# Patient Record
Sex: Male | Born: 1955 | Race: Black or African American | Hispanic: No | Smoking: Current every day smoker
Health system: Southern US, Community
[De-identification: ages and names within clinical notes are randomized; demographics above are authoritative.]

## PROBLEM LIST (undated history)

## (undated) VITALS — BP 131/91 | HR 63 | Temp 97.8°F | Resp 20 | Ht 70.5 in | Wt 184.0 lb

## (undated) DIAGNOSIS — C61 Malignant neoplasm of prostate: Secondary | ICD-10-CM

## (undated) DIAGNOSIS — M1990 Unspecified osteoarthritis, unspecified site: Secondary | ICD-10-CM

## (undated) DIAGNOSIS — I10 Essential (primary) hypertension: Secondary | ICD-10-CM

## (undated) HISTORY — PX: TONSILLECTOMY: SUR1361

## (undated) HISTORY — PX: COLONOSCOPY: SHX174

---

## 1999-04-18 ENCOUNTER — Emergency Department (HOSPITAL_COMMUNITY): Admission: EM | Admit: 1999-04-18 | Discharge: 1999-04-18 | Payer: Self-pay | Admitting: Emergency Medicine

## 2007-02-02 ENCOUNTER — Emergency Department (HOSPITAL_COMMUNITY): Admission: EM | Admit: 2007-02-02 | Discharge: 2007-02-02 | Payer: Self-pay | Admitting: Emergency Medicine

## 2007-02-16 ENCOUNTER — Emergency Department (HOSPITAL_COMMUNITY): Admission: EM | Admit: 2007-02-16 | Discharge: 2007-02-16 | Payer: Self-pay | Admitting: Emergency Medicine

## 2007-03-20 ENCOUNTER — Encounter (INDEPENDENT_AMBULATORY_CARE_PROVIDER_SITE_OTHER): Payer: Self-pay | Admitting: Family Medicine

## 2007-03-20 ENCOUNTER — Ambulatory Visit: Payer: Self-pay | Admitting: Internal Medicine

## 2007-03-20 LAB — CONVERTED CEMR LAB
ALT: 13 units/L (ref 0–53)
AST: 13 units/L (ref 0–37)
Albumin: 4.3 g/dL (ref 3.5–5.2)
Alkaline Phosphatase: 35 units/L — ABNORMAL LOW (ref 39–117)
BUN: 13 mg/dL (ref 6–23)
Basophils Absolute: 0 10*3/uL (ref 0.0–0.1)
Basophils Relative: 1 % (ref 0–1)
CO2: 27 meq/L (ref 19–32)
Calcium: 9.6 mg/dL (ref 8.4–10.5)
Chloride: 102 meq/L (ref 96–112)
Cholesterol: 147 mg/dL (ref 0–200)
Creatinine, Ser: 1.18 mg/dL (ref 0.40–1.50)
Eosinophils Absolute: 0.2 10*3/uL (ref 0.0–0.7)
Eosinophils Relative: 4 % (ref 0–5)
Glucose, Bld: 142 mg/dL — ABNORMAL HIGH (ref 70–99)
HCT: 41.4 % (ref 39.0–52.0)
HDL: 33 mg/dL — ABNORMAL LOW (ref >39)
Hemoglobin: 12.9 g/dL — ABNORMAL LOW (ref 13.0–17.0)
LDL Cholesterol: 97 mg/dL (ref 0–99)
Lymphocytes Relative: 34 % (ref 12–46)
Lymphs Abs: 2 10*3/uL (ref 0.7–4.0)
MCHC: 31.2 g/dL (ref 30.0–36.0)
MCV: 85.2 fL (ref 78.0–100.0)
Monocytes Absolute: 0.4 10*3/uL (ref 0.1–1.0)
Monocytes Relative: 7 % (ref 3–12)
Neutro Abs: 3.2 10*3/uL (ref 1.7–7.7)
Neutrophils Relative %: 54 % (ref 43–77)
Platelets: 414 10*3/uL — ABNORMAL HIGH (ref 150–400)
Potassium: 4.3 meq/L (ref 3.5–5.3)
RBC: 4.86 M/uL (ref 4.22–5.81)
RDW: 15.2 % (ref 11.5–15.5)
Sodium: 141 meq/L (ref 135–145)
Total Bilirubin: 0.4 mg/dL (ref 0.3–1.2)
Total CHOL/HDL Ratio: 4.5
Total Protein: 7.1 g/dL (ref 6.0–8.3)
Triglycerides: 84 mg/dL (ref <150)
VLDL: 17 mg/dL (ref 0–40)
WBC: 5.9 10*3/uL (ref 4.0–10.5)

## 2007-03-21 ENCOUNTER — Ambulatory Visit: Payer: Self-pay | Admitting: *Deleted

## 2008-09-15 ENCOUNTER — Emergency Department (HOSPITAL_COMMUNITY): Admission: EM | Admit: 2008-09-15 | Discharge: 2008-09-15 | Payer: Self-pay | Admitting: Emergency Medicine

## 2008-09-15 ENCOUNTER — Ambulatory Visit: Payer: Self-pay | Admitting: Internal Medicine

## 2008-10-25 ENCOUNTER — Observation Stay (HOSPITAL_COMMUNITY): Admission: AD | Admit: 2008-10-25 | Discharge: 2008-10-25 | Payer: Self-pay | Admitting: Psychiatry

## 2008-10-25 ENCOUNTER — Ambulatory Visit: Payer: Self-pay | Admitting: Psychiatry

## 2008-10-25 ENCOUNTER — Emergency Department (HOSPITAL_COMMUNITY): Admission: EM | Admit: 2008-10-25 | Discharge: 2008-10-25 | Payer: Self-pay | Admitting: Emergency Medicine

## 2010-01-17 ENCOUNTER — Emergency Department (HOSPITAL_COMMUNITY)
Admission: EM | Admit: 2010-01-17 | Discharge: 2010-01-17 | Disposition: A | Discharge status: Other Acute Inpatient Hospital | Payer: Self-pay | Source: Home / Self Care | Admitting: Emergency Medicine

## 2010-01-18 ENCOUNTER — Inpatient Hospital Stay (HOSPITAL_COMMUNITY)
Admission: EM | Admit: 2010-01-18 | Discharge: 2010-01-20 | Payer: Self-pay | Source: Home / Self Care | Attending: Psychiatry | Admitting: Psychiatry

## 2010-01-18 DIAGNOSIS — F329 Major depressive disorder, single episode, unspecified: Secondary | ICD-10-CM

## 2010-01-18 LAB — DIFFERENTIAL
Basophils Absolute: 0 10*3/uL (ref 0.0–0.1)
Basophils Relative: 0 % (ref 0–1)
Eosinophils Absolute: 0.2 10*3/uL (ref 0.0–0.7)
Eosinophils Relative: 3 % (ref 0–5)
Lymphocytes Relative: 30 % (ref 12–46)
Lymphs Abs: 2 10*3/uL (ref 0.7–4.0)
Monocytes Absolute: 0.5 10*3/uL (ref 0.1–1.0)
Monocytes Relative: 7 % (ref 3–12)
Neutro Abs: 4 10*3/uL (ref 1.7–7.7)
Neutrophils Relative %: 60 % (ref 43–77)

## 2010-01-18 LAB — CBC
HCT: 39.5 % (ref 39.0–52.0)
Hemoglobin: 12.3 g/dL — ABNORMAL LOW (ref 13.0–17.0)
MCH: 26.5 pg (ref 26.0–34.0)
MCHC: 31.1 g/dL (ref 30.0–36.0)
MCV: 84.9 fL (ref 78.0–100.0)
Platelets: 416 10*3/uL — ABNORMAL HIGH (ref 150–400)
RBC: 4.65 MIL/uL (ref 4.22–5.81)
RDW: 15.3 % (ref 11.5–15.5)
WBC: 6.8 10*3/uL (ref 4.0–10.5)

## 2010-01-18 LAB — RAPID URINE DRUG SCREEN, HOSP PERFORMED
Amphetamines: NOT DETECTED
Barbiturates: NOT DETECTED
Benzodiazepines: NOT DETECTED
Cocaine: NOT DETECTED
Opiates: NOT DETECTED
Tetrahydrocannabinol: NOT DETECTED

## 2010-01-18 LAB — BASIC METABOLIC PANEL
BUN: 14 mg/dL (ref 6–23)
CO2: 29 mEq/L (ref 19–32)
Calcium: 8.9 mg/dL (ref 8.4–10.5)
Chloride: 104 mEq/L (ref 96–112)
Creatinine, Ser: 1.21 mg/dL (ref 0.4–1.5)
GFR calc Af Amer: >60 mL/min (ref >60)
GFR calc non Af Amer: >60 mL/min (ref >60)
Glucose, Bld: 84 mg/dL (ref 70–99)
Potassium: 4.1 mEq/L (ref 3.5–5.1)
Sodium: 141 mEq/L (ref 135–145)

## 2010-01-18 LAB — ETHANOL: Alcohol, Ethyl (B): <5 mg/dL (ref 0–10)

## 2010-01-18 LAB — TRICYCLICS SCREEN, URINE: TCA Scrn: NOT DETECTED

## 2010-01-20 LAB — GLUCOSE, CAPILLARY: Glucose-Capillary: 68 mg/dL — ABNORMAL LOW (ref 70–99)

## 2010-01-21 NOTE — Consult Note (Signed)
  [name], [name]               ACCOUNT NO.:  [id number]  MEDICAL RECORD NO.:  [id number]          PATIENT TYPE:  IPS  LOCATION:  0301                          FACILITY:  BH  PHYSICIAN:  Eulogio Ditch, MD DATE OF BIRTH:  [birth year]  DATE OF CONSULTATION:  01/18/2010 DATE OF DISCHARGE:  01/20/2010                                CONSULTATION   REASON FOR CONSULT:  Suicidal ideation with plan to drink bleach.  HISTORY OF PRESENT ILLNESS:  A 55 year old African American male who was seen in [facility] ER.  The patient reported depressed mood because of the financial and work stress.  The patient want to be admitted to the [facility] for stabilization.  The patient denied hearing any voices.  He is logical and goal directed during interview, was not delusional.  The patient was started on Celexa 20 mg p.o. daily.  Risks, benefits, and side effects discussed with the patient.  PAST PSYCH HISTORY:  The patient has one admission to Behavioral Health in the past.  The patient was at a conflict with his boss at that time and was drinking alcohol.  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  No legal problems reported.  MEDICAL HISTORY:  History of hypertension.  The patient is on lisinopril 20 mg p.o. daily.  ALLERGIES:  THE PATIENT IS ALLERGIC TO ASPIRIN AND PENICILLIN.  MENTAL STATUS EXAMINATION:  Calm, cooperative during the interview. Hygiene, grooming fair.  Fair eye contact.  No psychomotor agitation or retardation noted.  Speech normal.  Mood, depressed affect, mood- congruent.  Thought process logical and goal directed.  Thought content not suicidal or homicidal, not delusional.  Thought perception, no audio hallucinations reported, not internally preoccupied.  Cognition, is alert, awake, oriented x3.  Memory, immediate and recent remote intact. Attention and concentration fair.  Abstraction ability good.  Fund of knowledge fair.   Insight and judgment fair.  DIAGNOSES:  AXIS I:  Depressive disorder, NOS, rule out adjustment disorder, depressed type, rule out major depressive disorder, recurrent type.  AXIS II:  Deferred. AXIS III:  Hypertension. AXIS IV:  Financial stress. AXIS V:  40.  RECOMMENDATIONS: 1. Celexa 20 mg p.o. daily started. 2. The patient will be transferred to [facility] for further     stabilization.     Eulogio Ditch, MD     SA/MEDQ  D:  01/21/2010  T:  01/21/2010  Job:  [id number]  Electronically Signed by Eulogio Ditch  on 01/21/2010 02:40:07 PM

## 2010-01-21 NOTE — Discharge Summary (Signed)
  [name], [name]               ACCOUNT NO.:  [id number]  MEDICAL RECORD NO.:  [id number]          PATIENT TYPE:  IPS  LOCATION:  0301                          FACILITY:  BH  PHYSICIAN:  Eulogio Ditch, MD DATE OF BIRTH:  1955-06-12  DATE OF ADMISSION:  01/18/2010 DATE OF DISCHARGE:  01/20/2010                              DISCHARGE SUMMARY   REASON FOR ADMISSION:  Suicidal ideations with a plan to drink bleach.  HOSPITAL COURSE:  Please refer to the initial psych assessment for circumstances leading towards patient's admission and other parts of the history.  Briefly, a 55 year old African American male who was admitted for suicidal thoughts with a plan to drink bleach because of the financial and work stresses.  The patient was transferred from [facility] ER where I saw the patient as a consult.  When I saw the patient on patient on 01/18, he told me that he wanted to go back to work.  His job is going to start tomorrow at 1:00 p.m., and he already is in financial stress. He does not want to miss his job.  The patient was very adamant about discharge.  The case was discussed with the treatment team, and as the patient was psychiatrically stable, not having any suicidal or homicidal ideations, the patient was discharged to follow up in the outpatient setting.  DISCHARGE DIAGNOSES:  Depressive disorder NOS, rule out major depressive disorder recurrent type, adjustment disorder depressed type.  DISCHARGE MEDICATIONS:  Celexa 20 mg p.o. daily.  The patient was started on Celexa 20 while in the hospital.  The patient denied any side effects from this medication.  DISCHARGE FOLLOWUP:  The patient will follow with Kenyon Ana, LCSW at [address], [facility] [place], [place], phone number [phone].     Eulogio Ditch, MD     SA/MEDQ  D:  01/21/2010  T:  01/21/2010  Job:  [id number]  Electronically Signed by Eulogio Ditch  on 01/21/2010 02:40:03 PM

## 2010-04-08 LAB — POCT I-STAT, CHEM 8
BUN: 13 mg/dL (ref 6–23)
Calcium, Ion: 1.08 mmol/L — ABNORMAL LOW (ref 1.12–1.32)
Chloride: 104 mEq/L (ref 96–112)
Creatinine, Ser: 1.1 mg/dL (ref 0.4–1.5)
Glucose, Bld: 102 mg/dL — ABNORMAL HIGH (ref 70–99)
HCT: 49 % (ref 39.0–52.0)
Hemoglobin: 16.7 g/dL (ref 13.0–17.0)
Potassium: 4 mEq/L (ref 3.5–5.1)
Sodium: 143 mEq/L (ref 135–145)
TCO2: 29 mmol/L (ref 0–100)

## 2010-04-08 LAB — CBC
HCT: 44 % (ref 39.0–52.0)
Hemoglobin: 14.3 g/dL (ref 13.0–17.0)
MCHC: 32.4 g/dL (ref 30.0–36.0)
MCV: 87.3 fL (ref 78.0–100.0)
Platelets: 498 10*3/uL — ABNORMAL HIGH (ref 150–400)
RBC: 5.05 MIL/uL (ref 4.22–5.81)
RDW: 13.9 % (ref 11.5–15.5)
WBC: 6.6 10*3/uL (ref 4.0–10.5)

## 2010-04-08 LAB — DIFFERENTIAL
Basophils Absolute: 0.1 10*3/uL (ref 0.0–0.1)
Basophils Relative: 1 % (ref 0–1)
Eosinophils Absolute: 0.3 10*3/uL (ref 0.0–0.7)
Eosinophils Relative: 4 % (ref 0–5)
Lymphocytes Relative: 25 % (ref 12–46)
Lymphs Abs: 1.7 10*3/uL (ref 0.7–4.0)
Monocytes Absolute: 0.2 10*3/uL (ref 0.1–1.0)
Monocytes Relative: 4 % (ref 3–12)
Neutro Abs: 4.2 10*3/uL (ref 1.7–7.7)
Neutrophils Relative %: 66 % (ref 43–77)

## 2010-04-08 LAB — RAPID URINE DRUG SCREEN, HOSP PERFORMED
Amphetamines: NOT DETECTED
Barbiturates: NOT DETECTED
Benzodiazepines: NOT DETECTED
Cocaine: NOT DETECTED
Opiates: NOT DETECTED
Tetrahydrocannabinol: NOT DETECTED

## 2010-04-08 LAB — ETHANOL: Alcohol, Ethyl (B): 208 mg/dL — ABNORMAL HIGH (ref 0–10)

## 2010-04-09 LAB — DIFFERENTIAL
Basophils Absolute: 0 10*3/uL (ref 0.0–0.1)
Basophils Relative: 1 % (ref 0–1)
Eosinophils Absolute: 0.2 10*3/uL (ref 0.0–0.7)
Eosinophils Relative: 2 % (ref 0–5)
Lymphocytes Relative: 21 % (ref 12–46)
Lymphs Abs: 2 10*3/uL (ref 0.7–4.0)
Monocytes Absolute: 0.6 10*3/uL (ref 0.1–1.0)
Monocytes Relative: 7 % (ref 3–12)
Neutro Abs: 6.7 10*3/uL (ref 1.7–7.7)
Neutrophils Relative %: 70 % (ref 43–77)

## 2010-04-09 LAB — CBC
HCT: 35.2 % — ABNORMAL LOW (ref 39.0–52.0)
Hemoglobin: 11.6 g/dL — ABNORMAL LOW (ref 13.0–17.0)
MCHC: 32.9 g/dL (ref 30.0–36.0)
MCV: 86.2 fL (ref 78.0–100.0)
Platelets: 333 10*3/uL (ref 150–400)
RBC: 4.09 MIL/uL — ABNORMAL LOW (ref 4.22–5.81)
RDW: 15.1 % (ref 11.5–15.5)
WBC: 9.6 10*3/uL (ref 4.0–10.5)

## 2010-04-09 LAB — COMPREHENSIVE METABOLIC PANEL
ALT: 19 U/L (ref 0–53)
AST: 18 U/L (ref 0–37)
Albumin: 3.8 g/dL (ref 3.5–5.2)
Alkaline Phosphatase: 29 U/L — ABNORMAL LOW (ref 39–117)
BUN: 24 mg/dL — ABNORMAL HIGH (ref 6–23)
CO2: 29 mEq/L (ref 19–32)
Calcium: 9 mg/dL (ref 8.4–10.5)
Chloride: 105 mEq/L (ref 96–112)
Creatinine, Ser: 1.28 mg/dL (ref 0.4–1.5)
GFR calc Af Amer: >60 mL/min (ref >60)
GFR calc non Af Amer: 59 mL/min — ABNORMAL LOW (ref >60)
Glucose, Bld: 100 mg/dL — ABNORMAL HIGH (ref 70–99)
Potassium: 4.4 mEq/L (ref 3.5–5.1)
Sodium: 138 mEq/L (ref 135–145)
Total Bilirubin: 0.3 mg/dL (ref 0.3–1.2)
Total Protein: 6.5 g/dL (ref 6.0–8.3)

## 2010-04-09 LAB — SAMPLE TO BLOOD BANK

## 2010-04-09 LAB — HEMOCCULT GUIAC POC 1CARD (OFFICE): Fecal Occult Bld: POSITIVE

## 2010-07-27 ENCOUNTER — Emergency Department (HOSPITAL_COMMUNITY)
Admission: EM | Admit: 2010-07-27 | Discharge: 2010-07-28 | Disposition: A | Discharge status: Other Acute Inpatient Hospital | Payer: Self-pay | Attending: Emergency Medicine | Admitting: Emergency Medicine

## 2010-07-27 DIAGNOSIS — F3289 Other specified depressive episodes: Secondary | ICD-10-CM | POA: Insufficient documentation

## 2010-07-27 DIAGNOSIS — R300 Dysuria: Secondary | ICD-10-CM | POA: Insufficient documentation

## 2010-07-27 DIAGNOSIS — I10 Essential (primary) hypertension: Secondary | ICD-10-CM | POA: Insufficient documentation

## 2010-07-27 DIAGNOSIS — F329 Major depressive disorder, single episode, unspecified: Secondary | ICD-10-CM | POA: Insufficient documentation

## 2010-07-27 DIAGNOSIS — R079 Chest pain, unspecified: Secondary | ICD-10-CM | POA: Insufficient documentation

## 2010-07-27 DIAGNOSIS — R45851 Suicidal ideations: Secondary | ICD-10-CM | POA: Insufficient documentation

## 2010-07-27 DIAGNOSIS — R443 Hallucinations, unspecified: Secondary | ICD-10-CM | POA: Insufficient documentation

## 2010-07-27 LAB — DIFFERENTIAL
Basophils Absolute: 0 10*3/uL (ref 0.0–0.1)
Basophils Relative: 0 % (ref 0–1)
Eosinophils Absolute: 0.4 10*3/uL (ref 0.0–0.7)
Eosinophils Relative: 5 % (ref 0–5)
Lymphocytes Relative: 31 % (ref 12–46)
Lymphs Abs: 2.8 10*3/uL (ref 0.7–4.0)
Monocytes Absolute: 0.6 10*3/uL (ref 0.1–1.0)
Monocytes Relative: 7 % (ref 3–12)
Neutro Abs: 5.2 10*3/uL (ref 1.7–7.7)
Neutrophils Relative %: 57 % (ref 43–77)

## 2010-07-27 LAB — CBC
HCT: 43.4 % (ref 39.0–52.0)
Hemoglobin: 14.5 g/dL (ref 13.0–17.0)
MCH: 27.5 pg (ref 26.0–34.0)
MCHC: 33.4 g/dL (ref 30.0–36.0)
MCV: 82.2 fL (ref 78.0–100.0)
Platelets: 391 10*3/uL (ref 150–400)
RBC: 5.28 MIL/uL (ref 4.22–5.81)
RDW: 15.2 % (ref 11.5–15.5)
WBC: 9.1 10*3/uL (ref 4.0–10.5)

## 2010-07-27 LAB — COMPREHENSIVE METABOLIC PANEL
ALT: 16 U/L (ref 0–53)
AST: 17 U/L (ref 0–37)
Albumin: 4.2 g/dL (ref 3.5–5.2)
Alkaline Phosphatase: 42 U/L (ref 39–117)
BUN: 18 mg/dL (ref 6–23)
CO2: 31 mEq/L (ref 19–32)
Calcium: 9.2 mg/dL (ref 8.4–10.5)
Chloride: 97 mEq/L (ref 96–112)
Creatinine, Ser: 1.28 mg/dL (ref 0.50–1.35)
GFR calc Af Amer: >60 mL/min (ref >60)
GFR calc non Af Amer: 59 mL/min — ABNORMAL LOW (ref >60)
Glucose, Bld: 93 mg/dL (ref 70–99)
Potassium: 3.3 mEq/L — ABNORMAL LOW (ref 3.5–5.1)
Sodium: 137 mEq/L (ref 135–145)
Total Bilirubin: 0.1 mg/dL — ABNORMAL LOW (ref 0.3–1.2)
Total Protein: 7.7 g/dL (ref 6.0–8.3)

## 2010-07-27 LAB — URINALYSIS, ROUTINE W REFLEX MICROSCOPIC
Bilirubin Urine: NEGATIVE
Glucose, UA: NEGATIVE mg/dL
Hgb urine dipstick: NEGATIVE
Ketones, ur: NEGATIVE mg/dL
Leukocytes, UA: NEGATIVE
Nitrite: NEGATIVE
Protein, ur: NEGATIVE mg/dL
Specific Gravity, Urine: 1.026 (ref 1.005–1.030)
Urobilinogen, UA: 0.2 mg/dL (ref 0.0–1.0)
pH: 5.5 (ref 5.0–8.0)

## 2010-07-27 LAB — RAPID URINE DRUG SCREEN, HOSP PERFORMED
Amphetamines: NOT DETECTED
Barbiturates: NOT DETECTED
Benzodiazepines: NOT DETECTED
Cocaine: NOT DETECTED
Opiates: NOT DETECTED
Tetrahydrocannabinol: NOT DETECTED

## 2010-07-28 ENCOUNTER — Inpatient Hospital Stay (HOSPITAL_COMMUNITY)
Admission: AD | Admit: 2010-07-28 | Discharge: 2010-07-30 | DRG: 882 | Disposition: A | Discharge status: Home / Self Care | Payer: Self-pay | Source: Ambulatory Visit | Attending: Psychiatry | Admitting: Psychiatry

## 2010-07-28 DIAGNOSIS — I10 Essential (primary) hypertension: Secondary | ICD-10-CM

## 2010-07-28 DIAGNOSIS — R45851 Suicidal ideations: Secondary | ICD-10-CM

## 2010-07-28 DIAGNOSIS — R45850 Homicidal ideations: Secondary | ICD-10-CM

## 2010-07-28 DIAGNOSIS — F319 Bipolar disorder, unspecified: Secondary | ICD-10-CM

## 2010-07-28 DIAGNOSIS — F4325 Adjustment disorder with mixed disturbance of emotions and conduct: Principal | ICD-10-CM

## 2010-07-28 DIAGNOSIS — F6081 Narcissistic personality disorder: Secondary | ICD-10-CM

## 2010-07-29 DIAGNOSIS — F1010 Alcohol abuse, uncomplicated: Secondary | ICD-10-CM

## 2010-07-29 DIAGNOSIS — F319 Bipolar disorder, unspecified: Secondary | ICD-10-CM

## 2010-08-02 NOTE — Assessment & Plan Note (Signed)
NAMEKATHERINE, [name]               ACCOUNT NO.:  [id number]  MEDICAL RECORD NO.:  [id number]  LOCATION:  0401                          FACILITY:  BH  PHYSICIAN:  Eulogio Ditch, MD DATE OF BIRTH:  1955-05-21  DATE OF ADMISSION:  07/28/2010 DATE OF DISCHARGE:                      PSYCHIATRIC ADMISSION ASSESSMENT   IDENTIFICATION:  A 55 year old African American male.  This is a voluntary admission.  HISTORY OF PRESENT ILLNESS:  Tayron presents with an exacerbation of depressive symptoms and says that he has been hearing voices for the past 2 weeks telling him that he needed to hurt himself.  He says that he came close to harming himself and had thoughts of overdosing on his live-in girlfriend's medications and her son's medications.  Instead, he came to the emergency room for help.  He is asking for help with the thoughts.  He says that the relationship with his girlfriend is not working out and he feels he cannot return there.  They have recent chronic conflict over her use of alcohol and marijuana most every day. Travon himself reports that he occasionally drinks alcohol but is working full time and is the only bread winner in the home.  Denies any use of other substances.  Denies drinking to intoxication.  He endorses that he has had homicidal thoughts towards his girlfriend, but has no plan to harm her and does not intend to harm her.  PAST PSYCHIATRIC HISTORY:  Two previous admissions, 1 to [facility] in October 2010 when he was intoxicated and threatened to shoot himself, and in January 2012 with suicidal thoughts and a plan to drink bleach. Previous medication trial includes Celexa 20 mg which he reports he did not take for any length of time after leaving [facility].  Has also taken Prozac in the past which helped him.  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  No legal problems.  He is currently living with  his girlfriend of many years and her 52 year old son who is on disability.  FAMILY HISTORY:  Denies a family history of mental illness or substance abuse.  MEDICAL HISTORY:  No regular primary care provider.  MEDICAL PROBLEMS:  Hypertension.  CURRENT MEDICATIONS:  HCTZ 25 mg daily.  DRUG ALLERGIES:  Aspirin  PHYSICAL EXAMINATION:  GENERAL:  Physical examination was done and is noted in the emergency room records.  This is a fully alert, normally- developed [place] American male who appears to be his stated age, appears healthy. VITAL SIGNS:  Temperature 97.6, pulse 78, respirations 18, blood pressure 126/90.  CBC is normal, hemoglobin 14.5.  Routine UA is normal. Urine drug screen negative for all substances.  Chemistry and liver enzymes all normal.  He appears to have no alcohol withdrawal symptoms and is no physical distress.  MENTAL STATUS EXAM:  Fully alert male, cooperative, pleasant, in full contact with reality.  Speech is cooperative, good eye contact.  Speech normal.  Mood depressed.  Mildly irritable.  Thinking is logical and coherent.  He does endorse having suicidal thoughts for 2 weeks with a plan in place and homicidal thoughts towards his girlfriend without intent.  Initially, he states in  the conversation that he would like to harm her, then later as the conversation progresses, he says he has a safe place to go so he is not back in the same situation and does not intend to actually harm her.  Working memory is intact.  Insight adequate.  Impulse control and judgment normal.  ADMISSION DIAGNOSES:  AXIS I:  Depressive disorder NOS, rule out bipolar disorder. AXIS II:  No diagnosis. AXIS III:  Hypertension by history. AXIS IV:  Severe domestic conflict. AXIS V:  Current 48, past year not known.  PLAN:  Voluntarily admit him with a goal of alleviating his suicidal thoughts.  He has a history of mood lability.  We started him on Risperdal 0.5 mg q.h.s.  His  initial assessment has been done by Dr. Orson Aloe in the absence of Dr. Rogers Blocker.     Margaret A. Lorin Picket, N.P.   ______________________________ Eulogio Ditch, MD    MAS/MEDQ  D:  07/29/2010  T:  07/29/2010  Job:  [id number]  Electronically Signed by Kari Baars N.P. on 07/30/2010 09:12:32 AM Electronically Signed by Eulogio Ditch  on 08/02/2010 05:07:40 PM

## 2010-08-06 NOTE — Discharge Summary (Signed)
  [name], [name]               ACCOUNT NO.:  [id number]  MEDICAL RECORD NO.:  [id number]  LOCATION:  0401                          FACILITY:  BH  PHYSICIAN:  Orson Aloe, MD       DATE OF BIRTH:  06-Feb-1955  DATE OF ADMISSION:  07/28/2010 DATE OF DISCHARGE:  07/30/2010                              DISCHARGE SUMMARY   IDENTIFYING INFORMATION:  This is a single 55 year old male.  This is a voluntary admission.  HISTORY OF THE PRESENT ILLNESS:  Laiden presented by way of our emergency room with an exacerbation of depressive symptoms and said that he had been hearing some voices for the previous 2 weeks telling him that he needed to hurt himself.  Said that he had come close to overdosing on his live-in girlfriend's medications.  Had had recent conflict with her over their lifestyle and her habitual use of alcohol and marijuana.  Felt that the relationship was no longer working and would like to have her out of the house.  He initially endorsed homicidal thoughts towards his girlfriend.  FINDINGS:  He was admitted to our acute stabilization unit and presented fully alert, in full contact with reality, cooperative, and directable. Mood depressed.  Mildly irritable with logical thinking, nonpsychotic. He did report homicidal thoughts towards his girlfriend.  By the 27th, he had been taking Risperdal 0.5 mg at h.s.  He is denying any suicidal thoughts but we felt for safety reasons that we would exercise our duty to warn her regarding his homicidal thoughts although he denied a current plan.  Our case worker spoke with the police who requested we not discharge him prior to 6 p.m. on Friday the 27th.  He was in agreement to follow up with [facility] on Tuesday, July 31st, at 9 a.m. and expressed a plan to go live with friends.  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 AXIS II:  Narcissistic personality  disorder. AXIS III:  Hypertension by history. AXIS IV:  Severe economic and housing stressors. AXIS V:  Current 47, past year not known.  PLAN:  Follow up with [facility] as previously noted.  DISCHARGE MEDICATIONS:  Risperdal 0.5 mg p.o. q.h.s.    Margaret A. Lorin Picket, N.P.   ______________________________ Orson Aloe, MD   MAS/MEDQ  D:  08/02/2010  T:  08/02/2010  Job:  [id number]  Electronically Signed by Kari Baars N.P. on 08/02/2010 02:32:56 PM Electronically Signed by Orson Aloe  on 08/06/2010 10:36:34 AM

## 2010-09-24 LAB — I-STAT 8, (EC8 V) (CONVERTED LAB)
Bicarbonate: 28.3 — ABNORMAL HIGH
HCT: 43
Hemoglobin: 14.6
Operator id: 247071
pCO2, Ven: 57.6 — ABNORMAL HIGH
pH, Ven: 7.299

## 2010-09-24 LAB — OCCULT BLOOD X 1 CARD TO LAB, STOOL: Fecal Occult Bld: POSITIVE

## 2010-09-24 LAB — DIFFERENTIAL
Basophils Absolute: 0
Basophils Relative: 1
Eosinophils Absolute: 0.5
Eosinophils Relative: 6 — ABNORMAL HIGH
Monocytes Absolute: 0.5

## 2010-09-24 LAB — CBC
HCT: 37.4 — ABNORMAL LOW
MCHC: 32.7
MCV: 84.6
Platelets: 423 — ABNORMAL HIGH
RDW: 15.3

## 2010-10-06 ENCOUNTER — Emergency Department (HOSPITAL_COMMUNITY)
Admission: EM | Admit: 2010-10-06 | Discharge: 2010-10-07 | Discharge status: Against Medical Advice | Payer: Self-pay | Attending: Emergency Medicine | Admitting: Emergency Medicine

## 2010-10-06 DIAGNOSIS — I10 Essential (primary) hypertension: Secondary | ICD-10-CM | POA: Insufficient documentation

## 2010-10-06 DIAGNOSIS — F1010 Alcohol abuse, uncomplicated: Secondary | ICD-10-CM | POA: Insufficient documentation

## 2010-10-06 LAB — DIFFERENTIAL
Eosinophils Absolute: 0.3 10*3/uL (ref 0.0–0.7)
Eosinophils Relative: 3 % (ref 0–5)
Lymphocytes Relative: 32 % (ref 12–46)
Lymphs Abs: 2.9 10*3/uL (ref 0.7–4.0)
Monocytes Relative: 6 % (ref 3–12)

## 2010-10-06 LAB — CBC
HCT: 39.9 % (ref 39.0–52.0)
MCH: 27 pg (ref 26.0–34.0)
MCV: 81.8 fL (ref 78.0–100.0)
Platelets: 374 10*3/uL (ref 150–400)
RBC: 4.88 MIL/uL (ref 4.22–5.81)
WBC: 9 10*3/uL (ref 4.0–10.5)

## 2010-10-07 ENCOUNTER — Emergency Department (HOSPITAL_COMMUNITY)
Admission: EM | Admit: 2010-10-07 | Discharge: 2010-10-09 | Disposition: A | Discharge status: Home / Self Care | Payer: Self-pay | Attending: Emergency Medicine | Admitting: Emergency Medicine

## 2010-10-07 DIAGNOSIS — I10 Essential (primary) hypertension: Secondary | ICD-10-CM | POA: Insufficient documentation

## 2010-10-07 DIAGNOSIS — E876 Hypokalemia: Secondary | ICD-10-CM | POA: Insufficient documentation

## 2010-10-07 DIAGNOSIS — F1010 Alcohol abuse, uncomplicated: Secondary | ICD-10-CM | POA: Insufficient documentation

## 2010-10-07 DIAGNOSIS — F3289 Other specified depressive episodes: Secondary | ICD-10-CM | POA: Insufficient documentation

## 2010-10-07 DIAGNOSIS — F329 Major depressive disorder, single episode, unspecified: Secondary | ICD-10-CM | POA: Insufficient documentation

## 2010-10-07 LAB — BASIC METABOLIC PANEL
CO2: 27 mEq/L (ref 19–32)
CO2: 28 mEq/L (ref 19–32)
Calcium: 9.1 mg/dL (ref 8.4–10.5)
Calcium: 9 mg/dL (ref 8.4–10.5)
Creatinine, Ser: 1.22 mg/dL (ref 0.50–1.35)
Creatinine, Ser: 1.07 mg/dL (ref 0.50–1.35)
GFR calc Af Amer: 77 mL/min — ABNORMAL LOW (ref >90)
GFR calc non Af Amer: 66 mL/min — ABNORMAL LOW (ref >90)
GFR calc non Af Amer: 77 mL/min — ABNORMAL LOW (ref >90)
Sodium: 141 mEq/L (ref 135–145)

## 2010-10-07 LAB — DIFFERENTIAL
Eosinophils Absolute: 0.6 10*3/uL (ref 0.0–0.7)
Lymphocytes Relative: 28 % (ref 12–46)
Lymphs Abs: 2.4 10*3/uL (ref 0.7–4.0)
Monocytes Relative: 6 % (ref 3–12)
Neutro Abs: 5.1 10*3/uL (ref 1.7–7.7)
Neutrophils Relative %: 59 % (ref 43–77)

## 2010-10-07 LAB — RAPID URINE DRUG SCREEN, HOSP PERFORMED
Amphetamines: NOT DETECTED
Amphetamines: NOT DETECTED
Barbiturates: NOT DETECTED
Benzodiazepines: POSITIVE — AB
Benzodiazepines: POSITIVE — AB
Tetrahydrocannabinol: NOT DETECTED

## 2010-10-07 LAB — CBC
HCT: 40.8 % (ref 39.0–52.0)
Hemoglobin: 13.5 g/dL (ref 13.0–17.0)
MCH: 27.1 pg (ref 26.0–34.0)
MCV: 81.9 fL (ref 78.0–100.0)
Platelets: 389 10*3/uL (ref 150–400)
RBC: 4.98 MIL/uL (ref 4.22–5.81)
WBC: 8.6 10*3/uL (ref 4.0–10.5)

## 2010-10-07 LAB — ETHANOL
Alcohol, Ethyl (B): 85 mg/dL — ABNORMAL HIGH (ref 0–11)
Alcohol, Ethyl (B): 99 mg/dL — ABNORMAL HIGH (ref 0–11)

## 2010-10-07 LAB — HEPATIC FUNCTION PANEL
Albumin: 3.7 g/dL (ref 3.5–5.2)
Total Protein: 7.2 g/dL (ref 6.0–8.3)

## 2010-10-08 LAB — ALT: ALT: 12 U/L (ref 0–53)

## 2010-10-12 ENCOUNTER — Ambulatory Visit (HOSPITAL_COMMUNITY)
Admission: EM | Admit: 2010-10-12 | Discharge: 2010-10-13 | Discharge status: Against Medical Advice | Payer: Self-pay | Attending: Emergency Medicine | Admitting: Emergency Medicine

## 2010-10-12 DIAGNOSIS — Z0189 Encounter for other specified special examinations: Secondary | ICD-10-CM | POA: Insufficient documentation

## 2010-10-13 ENCOUNTER — Emergency Department (HOSPITAL_COMMUNITY)
Admission: EM | Admit: 2010-10-13 | Discharge: 2010-10-13 | Discharge status: Against Medical Advice | Payer: Self-pay | Attending: Emergency Medicine | Admitting: Emergency Medicine

## 2010-10-13 ENCOUNTER — Emergency Department (HOSPITAL_COMMUNITY): Payer: Self-pay

## 2010-10-13 DIAGNOSIS — Z79899 Other long term (current) drug therapy: Secondary | ICD-10-CM | POA: Insufficient documentation

## 2010-10-13 DIAGNOSIS — F1010 Alcohol abuse, uncomplicated: Secondary | ICD-10-CM | POA: Insufficient documentation

## 2010-10-13 DIAGNOSIS — M2550 Pain in unspecified joint: Secondary | ICD-10-CM | POA: Insufficient documentation

## 2010-10-13 DIAGNOSIS — M722 Plantar fascial fibromatosis: Secondary | ICD-10-CM | POA: Insufficient documentation

## 2010-10-13 DIAGNOSIS — I10 Essential (primary) hypertension: Secondary | ICD-10-CM | POA: Insufficient documentation

## 2010-10-13 DIAGNOSIS — F329 Major depressive disorder, single episode, unspecified: Secondary | ICD-10-CM | POA: Insufficient documentation

## 2010-10-13 DIAGNOSIS — F3289 Other specified depressive episodes: Secondary | ICD-10-CM | POA: Insufficient documentation

## 2010-10-13 LAB — COMPREHENSIVE METABOLIC PANEL
Albumin: 3.9 g/dL (ref 3.5–5.2)
BUN: 9 mg/dL (ref 6–23)
Calcium: 9.8 mg/dL (ref 8.4–10.5)
Creatinine, Ser: 1.13 mg/dL (ref 0.50–1.35)
Total Protein: 7.7 g/dL (ref 6.0–8.3)

## 2010-10-13 LAB — DIFFERENTIAL
Eosinophils Absolute: 0.3 10*3/uL (ref 0.0–0.7)
Eosinophils Relative: 4 % (ref 0–5)
Lymphocytes Relative: 37 % (ref 12–46)
Lymphs Abs: 3.2 10*3/uL (ref 0.7–4.0)
Monocytes Absolute: 0.7 10*3/uL (ref 0.1–1.0)

## 2010-10-13 LAB — RAPID URINE DRUG SCREEN, HOSP PERFORMED
Amphetamines: NOT DETECTED
Benzodiazepines: POSITIVE — AB
Cocaine: NOT DETECTED
Opiates: NOT DETECTED

## 2010-10-13 LAB — CBC
HCT: 41.6 % (ref 39.0–52.0)
MCHC: 33.7 g/dL (ref 30.0–36.0)
MCV: 81.9 fL (ref 78.0–100.0)
RDW: 15 % (ref 11.5–15.5)

## 2010-10-13 LAB — ETHANOL: Alcohol, Ethyl (B): 66 mg/dL — ABNORMAL HIGH (ref 0–11)

## 2010-11-11 ENCOUNTER — Emergency Department (HOSPITAL_COMMUNITY)
Admission: EM | Admit: 2010-11-11 | Discharge: 2010-11-11 | Disposition: A | Discharge status: Home / Self Care | Payer: Self-pay | Attending: Emergency Medicine | Admitting: Emergency Medicine

## 2010-11-11 ENCOUNTER — Emergency Department (HOSPITAL_COMMUNITY): Payer: Self-pay

## 2010-11-11 ENCOUNTER — Encounter: Payer: Self-pay | Admitting: Emergency Medicine

## 2010-11-11 DIAGNOSIS — I10 Essential (primary) hypertension: Secondary | ICD-10-CM | POA: Insufficient documentation

## 2010-11-11 DIAGNOSIS — M7750 Other enthesopathy of unspecified foot: Secondary | ICD-10-CM | POA: Insufficient documentation

## 2010-11-11 DIAGNOSIS — M79609 Pain in unspecified limb: Secondary | ICD-10-CM | POA: Insufficient documentation

## 2010-11-11 DIAGNOSIS — M7740 Metatarsalgia, unspecified foot: Secondary | ICD-10-CM

## 2010-11-11 HISTORY — DX: Essential (primary) hypertension: I10

## 2010-11-11 MED ORDER — TRAMADOL HCL 50 MG PO TABS: 50.0000 mg | ORAL_TABLET | Freq: Four times a day (QID) | ORAL | Status: AC | PRN

## 2010-11-11 MED ORDER — HYDROCHLOROTHIAZIDE 25 MG PO TABS: 25.0000 mg | ORAL_TABLET | Freq: Every day | ORAL | Status: DC

## 2010-11-11 NOTE — ED Provider Notes (Signed)
History     CSN: [id number] Arrival date & time: 11/11/2010 10:51 AM   First MD Initiated Contact with Patient 11/11/10 1112      Foot pain  (Consider location/radiation/quality/duration/timing/severity/associated sxs/prior treatment) HPI Patient's complaining of right foot pain for last couple of weeks. Patient states it seems to hurt on the ball of his foot. Does not radiate. Symptoms have been constant and increased with walking and movement. The severity is mild to moderate. Patient denies any fevers or trauma. He denies any history of diabetes. Patient denies any fevers chills or swelling elsewhere. Past Medical History  Diagnosis Date  . Hypertension     History reviewed. No pertinent past surgical history.  No family history on file.  History  Substance Use Topics  . Smoking status: Current Everyday Smoker  . Smokeless tobacco: Not on file  . Alcohol Use:       Review of Systems  All other systems reviewed and are negative.    Allergies  Aspirin  Home Medications   Current Outpatient Rx  Name Route Sig Dispense Refill  . HYDROCHLOROTHIAZIDE 25 MG PO TABS Oral Take 25 mg by mouth daily.        BP 160/105  Pulse 73  Temp(Src) 99.1 F (37.3 C) (Oral)  Resp 20  SpO2 100%  Physical Exam  Nursing note and vitals reviewed. Constitutional: He appears well-developed and well-nourished. No distress.  HENT:  Head: Normocephalic and atraumatic.  Right Ear: External ear normal.  Left Ear: External ear normal.  Eyes: Conjunctivae are normal. Right eye exhibits no discharge. Left eye exhibits no discharge. No scleral icterus.  Neck: Neck supple. No tracheal deviation present.  Cardiovascular: Normal rate.   Pulmonary/Chest: Effort normal. No stridor. No respiratory distress.  Musculoskeletal: He exhibits tenderness. He exhibits no edema.       Right foot: He exhibits tenderness. He exhibits no swelling, normal capillary refill, no deformity and no laceration.         Tender to palpation plantar aspect along the metatarsal heads of the right midfoot, no definite plantar wart, no ulcerations  Neurological: He is alert. Cranial nerve deficit: no gross deficits.  Skin: Skin is warm and dry. No rash noted.  Psychiatric: He has a normal mood and affect.    ED Course  Procedures (including critical care time)  Labs Reviewed - No data to display Dg Foot Complete Right  11/11/2010  *RADIOLOGY REPORT*  Clinical Data: Pain and swelling.  RIGHT FOOT COMPLETE - 3+ VIEW  Comparison: 10/13/2010.  Findings: The joint spaces are maintained.  No acute bony findings or destructive bony changes.  No definite erosions.  Probable remote avulsion fracture involving the distal phalanx of the great toe laterally.  The mid and hind foot joint spaces are maintained. No fracture.  A calcaneal heel spurs noted.  IMPRESSION:   No acute bony findings or destructive bony changes.  Original Report Authenticated By: P. Loralie Champagne, M.D.        MDM  Patient with metatarsal pain. No evidence of acute bony abnormality. At this time there does not appear to be any evidence of any acute emergency medical condition. Patient believes it may be related to issues. He may have some plantar fasciitis. We'll discharge him home on a course of nonsteroidal medications. Recommend followup with a podiatrist.     Diagnoses: Metatarsalgia   Celene Kras, MD 11/11/10 1221

## 2010-11-11 NOTE — ED Notes (Signed)
Rt foot swollen x a couple of weeks  Has been soaking it but nothing helping

## 2010-11-21 ENCOUNTER — Encounter (HOSPITAL_COMMUNITY): Payer: Self-pay

## 2010-11-21 ENCOUNTER — Encounter (HOSPITAL_COMMUNITY): Payer: Self-pay | Admitting: Emergency Medicine

## 2010-11-21 ENCOUNTER — Emergency Department (HOSPITAL_COMMUNITY)
Admission: EM | Admit: 2010-11-21 | Discharge: 2010-11-21 | Disposition: A | Discharge status: Home / Self Care | Payer: Self-pay | Attending: Emergency Medicine | Admitting: Emergency Medicine

## 2010-11-21 ENCOUNTER — Observation Stay (HOSPITAL_COMMUNITY)
Admission: EM | Admit: 2010-11-21 | Discharge: 2010-11-21 | Disposition: A | Discharge status: Home / Self Care | Payer: Self-pay | Attending: Emergency Medicine | Admitting: Emergency Medicine

## 2010-11-21 DIAGNOSIS — I10 Essential (primary) hypertension: Secondary | ICD-10-CM | POA: Insufficient documentation

## 2010-11-21 DIAGNOSIS — T783XXA Angioneurotic edema, initial encounter: Principal | ICD-10-CM | POA: Insufficient documentation

## 2010-11-21 DIAGNOSIS — X58XXXA Exposure to other specified factors, initial encounter: Secondary | ICD-10-CM | POA: Insufficient documentation

## 2010-11-21 DIAGNOSIS — M79609 Pain in unspecified limb: Secondary | ICD-10-CM | POA: Insufficient documentation

## 2010-11-21 DIAGNOSIS — T7840XA Allergy, unspecified, initial encounter: Secondary | ICD-10-CM

## 2010-11-21 DIAGNOSIS — R609 Edema, unspecified: Secondary | ICD-10-CM | POA: Insufficient documentation

## 2010-11-21 DIAGNOSIS — Z79899 Other long term (current) drug therapy: Secondary | ICD-10-CM | POA: Insufficient documentation

## 2010-11-21 DIAGNOSIS — M109 Gout, unspecified: Secondary | ICD-10-CM | POA: Insufficient documentation

## 2010-11-21 MED ORDER — HYDROMORPHONE HCL PF 1 MG/ML IJ SOLN
1.0000 mg | Freq: Once | INTRAMUSCULAR | Status: AC
  Administered 2010-11-21: 1 mg via INTRAMUSCULAR
  Filled 2010-11-21: qty 1

## 2010-11-21 MED ORDER — PREDNISONE (PAK) 10 MG PO TABS: 10.0000 mg | ORAL_TABLET | Freq: Every day | ORAL | Status: AC

## 2010-11-21 MED ORDER — DIPHENHYDRAMINE HCL 50 MG/ML IJ SOLN: 50.0000 mg | Freq: Four times a day (QID) | INTRAMUSCULAR | Status: DC | PRN

## 2010-11-21 MED ORDER — DIPHENHYDRAMINE HCL 50 MG/ML IJ SOLN
50.0000 mg | Freq: Once | INTRAMUSCULAR | Status: AC
  Administered 2010-11-21: 50 mg via INTRAVENOUS
  Filled 2010-11-21: qty 1

## 2010-11-21 MED ORDER — METHYLPREDNISOLONE SODIUM SUCC 125 MG IJ SOLR
125.0000 mg | Freq: Once | INTRAMUSCULAR | Status: AC
  Administered 2010-11-21: 125 mg via INTRAVENOUS
  Filled 2010-11-21: qty 2

## 2010-11-21 MED ORDER — INDOMETHACIN 25 MG PO CAPS: 50.0000 mg | ORAL_CAPSULE | Freq: Three times a day (TID) | ORAL | Status: DC | PRN

## 2010-11-21 MED ORDER — INDOMETHACIN 25 MG PO CAPS
50.0000 mg | ORAL_CAPSULE | Freq: Once | ORAL | Status: AC
  Administered 2010-11-21: 50 mg via ORAL
  Filled 2010-11-21: qty 2

## 2010-11-21 MED ORDER — FAMOTIDINE 20 MG PO TABS: 20.0000 mg | ORAL_TABLET | Freq: Two times a day (BID) | ORAL | Status: DC

## 2010-11-21 MED ORDER — DIPHENHYDRAMINE HCL 25 MG PO TABS: 25.0000 mg | ORAL_TABLET | Freq: Four times a day (QID) | ORAL | Status: AC

## 2010-11-21 MED ORDER — FAMOTIDINE IN NACL 20-0.9 MG/50ML-% IV SOLN
20.0000 mg | Freq: Once | INTRAVENOUS | Status: AC
  Administered 2010-11-21: 20 mg via INTRAVENOUS
  Filled 2010-11-21: qty 50

## 2010-11-21 MED ORDER — HYDROCODONE-ACETAMINOPHEN 5-325 MG PO TABS: 1.0000 | ORAL_TABLET | ORAL | Status: AC | PRN

## 2010-11-21 NOTE — ED Notes (Signed)
Pt here for angioedema r/t medication he took PTA. Denies trouble breathing. Airway intact. sts a little trouble swallowing.

## 2010-11-21 NOTE — ED Notes (Signed)
Patient being discharged. IV d/c catheter intact, dressing applied. D/C Instructions explained to patient, verbalizes understanding, no further questions. Patient provided with prescriptions for Prednisone explained. Patient provided with cab voucher by charge RN. Patient escorted to discharge window with all personal belongings.

## 2010-11-21 NOTE — ED Provider Notes (Signed)
Medical screening examination/treatment/procedure(s) were conducted as a shared visit with non-physician practitioner(s) and myself.  I personally evaluated the patient during the encounter. I was the primary provider during this visit    Lyanne Co, MD 11/21/10 [phone]

## 2010-11-21 NOTE — ED Provider Notes (Signed)
Medical screening examination/treatment/procedure(s) were performed by non-physician practitioner and as supervising physician I was immediately available for consultation/collaboration.   Joya Gaskins, MD 11/21/10 [phone]

## 2010-11-21 NOTE — ED Provider Notes (Signed)
History     CSN: [id number] Arrival date & time: 11/21/2010 11:59 AM   First MD Initiated Contact with Patient 11/21/10 1204      Chief Complaint  Patient presents with  . Allergic Reaction    (Consider location/radiation/quality/duration/timing/severity/associated sxs/prior treatment) Patient is a 55 y.o. male presenting with allergic reaction. The history is provided by the patient.  Allergic Reaction   patient treated for nontraumatic right foot pain in the emergency department at approximately 25 minutes ago.  He was given an oral indomethacin as well as IM Dilaudid in the emergency department.  He now reports new swelling of his bilateral legs.  Denies throat tightness.  He's been able tolerate his secretions.  He denies tongue fullness.  He has no shortness of breath.  He has no history of angioedema or allergic reaction.  There is no history of asthma or eczema.  His had no history of anaphylaxis in the past.  She denies use of lisinopril.  He reports his foot pain is much better   Past Medical History  Diagnosis Date  . Hypertension     No past surgical history on file.  No family history on file.  History  Substance Use Topics  . Smoking status: Current Everyday Smoker  . Smokeless tobacco: Not on file  . Alcohol Use: No      Review of Systems  All other systems reviewed and are negative.    Allergies  Aspirin  Home Medications   Current Outpatient Rx  Name Route Sig Dispense Refill  . HYDROCHLOROTHIAZIDE 25 MG PO TABS Oral Take 1 tablet (25 mg total) by mouth daily. 30 tablet 0  . HYDROCODONE-ACETAMINOPHEN 5-325 MG PO TABS Oral Take 1 tablet by mouth every 4 (four) hours as needed for pain. 20 tablet 0  . INDOMETHACIN 25 MG PO CAPS Oral Take 2 capsules (50 mg total) by mouth 3 (three) times daily as needed. 30 capsule 0  . TRAMADOL HCL 50 MG PO TABS Oral Take 1 tablet (50 mg total) by mouth every 6 (six) hours as needed for pain. Maximum dose= 8 tablets  per day 30 tablet 0    BP 105/59  Pulse 79  Temp(Src) 98.2 F (36.8 C) (Oral)  Resp 20  SpO2 100%  Physical Exam  Constitutional: He is oriented to person, place, and time. He appears well-developed and well-nourished.  HENT:  Head: Normocephalic and atraumatic.  Mouth/Throat: Oropharynx is clear and moist.       Swelling of both his upper and lower lips there is mild.  Tolerating secretions.  No tongue swelling.  Uvula and posterior pharynx appears normal without swelling or closure of his airway.   Eyes: EOM are normal.  Neck: Normal range of motion.  Cardiovascular: Normal rate.   Pulmonary/Chest: Effort normal.  Musculoskeletal: Normal range of motion.  Neurological: He is alert and oriented to person, place, and time.  Psychiatric: He has a normal mood and affect.    ED Course  Procedures (including critical care time)  Labs Reviewed - No data to display No results found.   1. Allergic reaction   2. Angioedema    MDM  Patient is likely angioedema associated with NSAID use.  Patient will be given Benadryl Solu-Medrol and Pepcid IV in the emergency department.  He will be placed in the CDU on the allergic reaction protocol and monitored for at least 4 hours.  The patient is agreeable plan  1:48 PM Patient continues to feel well.  No worsening and swelling at this time.  We'll continue to monitor      Lyanne Co, MD 11/21/10 2300

## 2010-11-21 NOTE — ED Provider Notes (Signed)
History     CSN: [id number] Arrival date & time: 11/21/2010  8:14 AM   First MD Initiated Contact with Patient 11/21/10 [phone]      Chief Complaint  Patient presents with  . Foot Pain    (Consider location/radiation/quality/duration/timing/severity/associated sxs/prior treatment) HPI History provided by pt.   Pt has had non-traumatic pain/edema in right forefoot for greater than 1wk.  Pain aggravated by bearing weight and seems to gradually worsen throughout the day; worst in evening.  Radiates into great toe.  No associated fever.  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   Seen for same on 11/11/10, xray neg and pain thought to be d/t plantar fasciitis.  Has had no relief w/ pain medications. H/o gout in right great toe but presented differently w/ edema on medial aspect of joint rather than dorsal surface of foot.    Past Medical History  Diagnosis Date  . Hypertension     History reviewed. No pertinent past surgical history.  History reviewed. No pertinent family history.  History  Substance Use Topics  . Smoking status: Current Everyday Smoker  . Smokeless tobacco: Not on file  . Alcohol Use: No      Review of Systems  All other systems reviewed and are negative.    Allergies  Aspirin  Home Medications   Current Outpatient Rx  Name Route Sig Dispense Refill  . HYDROCHLOROTHIAZIDE 25 MG PO TABS Oral Take 1 tablet (25 mg total) by mouth daily. 30 tablet 0  . TRAMADOL HCL 50 MG PO TABS Oral Take 1 tablet (50 mg total) by mouth every 6 (six) hours as needed for pain. Maximum dose= 8 tablets per day 30 tablet 0    BP 137/99  Temp(Src) 97.9 F (36.6 C) (Oral)  Resp 20  Ht 5\' 10"  (1.778 m)  Wt 200 lb (90.719 kg)  BMI 28.70 kg/m2  SpO2 99%  Physical Exam  Nursing note and vitals reviewed. Constitutional: He is oriented to person, place, and time. He appears well-developed and well-nourished. No distress.  HENT:  Head: Normocephalic and  atraumatic.  Eyes:       Normal appearance  Neck: Normal range of motion.  Musculoskeletal:       Right foot w/ edema over dorsal surface of base of 1-3rd metatarsals.  Mild erythema over dorsal surface of 1st and 2nd MTP joints.  Bottom of foot appears nml.  Tenderness to light palpation of entire forefoot, both surfaces.  Pain w/ passive flexion of toes.  Distal sensation intact.  2+ DP pulse.  Neurological: He is alert and oriented to person, place, and time.  Psychiatric: He has a normal mood and affect. His behavior is normal.    ED Course  Procedures (including critical care time)  Labs Reviewed - No data to display No results found.   1. Gout       MDM  Healthy 55yo M presents w/ non-traumatic right foot pain/edema.  H/o gout.  History and exam more consistent w/ gout than plantar fasciitis or neuroma and xray neg for fx on 11/8.  Pt received a dose of indomethacin in ED.  No adverse reaction.  Also received IM dilaudid.  Ortho tech provided w/ crutches.  D/c'd home w/ indomethacin and vicodin.  Strict return precautions discussed.        Otilio Miu, PA 11/21/10 1010

## 2010-11-21 NOTE — ED Notes (Signed)
Rt. Foot pain and swelling, denies any injuries,  Began about 3 weeks ago

## 2010-11-21 NOTE — ED Provider Notes (Signed)
3:46 PM Patient is in CDU under observation, allergic reaction protocol.  Patient has been sleeping soundly on the monitor.  States his lips continue to be swollen, no swelling of his tongue or throat, denies difficulty breathing or swallowing.  On exam, pt is sleepy, NAD, RRR, lungs CTAB without wheezes or stridor, diffuse swelling of upper and lower lips, tongue is large, airway is patent.    6:24 PM Patient is feeling much better, lip swelling has improved.  States he is ready to go home.  Buses have stopped running and he has no other ride, we are arranging for ride home for him.    [name] [place], [place] 11/22/10 0020

## 2010-11-21 NOTE — ED Notes (Signed)
Patient feeling much better, all vitals stable, still with swelling to lips, requesting something to eat, sandwich bag provided at this time.  PA made aware patient ready to go home.

## 2010-11-21 NOTE — ED Notes (Signed)
Assumed care of patient, report from [facility].  Introduced self to patient.  Resting on ed stretcher, vitals stable, no complaints, patient still with swelling and tightness to lips but airway is intact, denies shortness of breath, call bell within reach, will continue to monitor.

## 2010-11-21 NOTE — ED Provider Notes (Signed)
History     CSN: [id number] Arrival date & time: 11/21/2010 11:59 AM   First MD Initiated Contact with Patient 11/21/10 1204      Chief Complaint  Patient presents with  . Allergic Reaction    (Consider location/radiation/quality/duration/timing/severity/associated sxs/prior treatment) HPI  Past Medical History  Diagnosis Date  . Hypertension     No past surgical history on file.  No family history on file.  History  Substance Use Topics  . Smoking status: Current Everyday Smoker  . Smokeless tobacco: Not on file  . Alcohol Use: No      Review of Systems  Allergies  Aspirin  Home Medications   Current Outpatient Rx  Name Route Sig Dispense Refill  . HYDROCHLOROTHIAZIDE 25 MG PO TABS Oral Take 1 tablet (25 mg total) by mouth daily. 30 tablet 0  . HYDROCODONE-ACETAMINOPHEN 5-325 MG PO TABS Oral Take 1 tablet by mouth every 4 (four) hours as needed for pain. 20 tablet 0  . INDOMETHACIN 25 MG PO CAPS Oral Take 2 capsules (50 mg total) by mouth 3 (three) times daily as needed. 30 capsule 0  . TRAMADOL HCL 50 MG PO TABS Oral Take 1 tablet (50 mg total) by mouth every 6 (six) hours as needed for pain. Maximum dose= 8 tablets per day 30 tablet 0    BP 105/59  Pulse 79  Temp(Src) 98.2 F (36.8 C) (Oral)  Resp 20  Ht 7\' 1"  (2.159 m)  Wt 200 lb (90.719 kg)  BMI 19.46 kg/m2  SpO2 100%  Physical Exam  ED Course  Procedures (including critical care time)  Labs Reviewed - No data to display No results found.   No diagnosis found.    MDM  Patient moved to the CDU for continued observation after acute onset of angioedema after having been given an indomethocin this morning for a gout flare, he remains with both upper and lower lip edema, no edema into the tongue, no trismus, no stridor.  Lungs CTA bilaterally.  Will continue to monitor.        [name] [place], [place] 11/21/10 1448  [name]. [place], [place] 11/21/10 [phone]

## 2010-11-22 NOTE — ED Provider Notes (Addendum)
Medical screening examination/treatment/procedure(s) were conducted as a shared visit with non-physician practitioner(s) and myself.  I personally evaluated the patient during the encounter  I was the primary provider of this patient during this ER visit. The patients care was continued in the CDU and managed in conjunction with my midlevel providers  1. Allergic reaction   2.  Angioedema (secondary to NSAID use)  Lyanne Co, MD 11/22/10 1478  Lyanne Co, MD 11/22/10 [phone]

## 2011-02-14 ENCOUNTER — Encounter (HOSPITAL_COMMUNITY): Payer: Self-pay | Admitting: *Deleted

## 2011-02-14 ENCOUNTER — Emergency Department (HOSPITAL_COMMUNITY)
Admission: EM | Admit: 2011-02-14 | Discharge: 2011-02-14 | Disposition: A | Discharge status: Home / Self Care | Payer: Self-pay | Attending: Emergency Medicine | Admitting: Emergency Medicine

## 2011-02-14 DIAGNOSIS — Z79899 Other long term (current) drug therapy: Secondary | ICD-10-CM | POA: Insufficient documentation

## 2011-02-14 DIAGNOSIS — I890 Lymphedema, not elsewhere classified: Secondary | ICD-10-CM | POA: Insufficient documentation

## 2011-02-14 DIAGNOSIS — R220 Localized swelling, mass and lump, head: Secondary | ICD-10-CM | POA: Insufficient documentation

## 2011-02-14 DIAGNOSIS — I10 Essential (primary) hypertension: Secondary | ICD-10-CM | POA: Insufficient documentation

## 2011-02-14 DIAGNOSIS — R599 Enlarged lymph nodes, unspecified: Secondary | ICD-10-CM | POA: Insufficient documentation

## 2011-02-14 MED ORDER — POTASSIUM CHLORIDE CRYS ER 20 MEQ PO TBCR: 20.0000 meq | EXTENDED_RELEASE_TABLET | Freq: Two times a day (BID) | ORAL | Status: DC

## 2011-02-14 MED ORDER — CLINDAMYCIN HCL 150 MG PO CAPS: 300.0000 mg | ORAL_CAPSULE | Freq: Four times a day (QID) | ORAL | Status: AC

## 2011-02-14 MED ORDER — CLINDAMYCIN HCL 300 MG PO CAPS
300.0000 mg | ORAL_CAPSULE | Freq: Once | ORAL | Status: AC
  Administered 2011-02-14: 300 mg via ORAL
  Filled 2011-02-14: qty 1

## 2011-02-14 NOTE — ED Notes (Signed)
Noticed swollen glands on R side of neck this am. (Denies: sore throat, fever, nvd, pain, sob or difficulty swallowing). Speech unaltered. Calm, NAD, alert.

## 2011-02-15 NOTE — ED Provider Notes (Signed)
History     CSN: [id number]  Arrival date & time 02/14/11  1850   First MD Initiated Contact with Patient 02/14/11 2115      Chief Complaint  Patient presents with  . Lymphadenopathy    (Consider location/radiation/quality/duration/timing/severity/associated sxs/prior treatment) The history is provided by the patient. No language interpreter was used.   CC: Patient c/o R painless mobile cervical lymph node swelling since this am. States he noticed this a week ago as well.  Past Medical History  Diagnosis Date  . Hypertension     History reviewed. No pertinent past surgical history.  Family History  Problem Relation Age of Onset  . Heart failure Mother   . Hypertension Father   . Parkinsonism Father     History  Substance Use Topics  . Smoking status: Current Everyday Smoker -- 0.5 packs/day  . Smokeless tobacco: Not on file  . Alcohol Use: Yes     casual      Review of Systems  HENT: Negative for ear pain, facial swelling, rhinorrhea, neck pain, neck stiffness, postnasal drip and tinnitus.   Eyes: Negative for pain.  Respiratory: Negative for choking, shortness of breath and stridor.     Allergies  Aspirin and Indocin  Home Medications   Current Outpatient Rx  Name Route Sig Dispense Refill  . ACETAMINOPHEN 500 MG PO TABS Oral Take 1,000 mg by mouth every 6 (six) hours as needed. For pain    . FAMOTIDINE 20 MG PO TABS Oral Take 20 mg by mouth 2 (two) times daily as needed. For heartburn/indigestion    . HYDROCHLOROTHIAZIDE 25 MG PO TABS Oral Take 25 mg by mouth daily.    Marland Kitchen CLINDAMYCIN HCL 150 MG PO CAPS Oral Take 2 capsules (300 mg total) by mouth every 6 (six) hours. 28 capsule 0    BP 133/88  Pulse 66  Temp(Src) 98.8 F (37.1 C) (Oral)  Resp 20  SpO2 100%  Physical Exam  Nursing note and vitals reviewed. Constitutional: He is oriented to person, place, and time. He appears well-developed and well-nourished.  HENT:  Head: Normocephalic and  atraumatic.  Eyes: Pupils are equal, round, and reactive to light.  Neck: Neck supple.  Cardiovascular: Normal rate and regular rhythm.  Exam reveals no gallop and no friction rub.   No murmur heard. Pulmonary/Chest: Breath sounds normal. No respiratory distress.  Abdominal: Soft. He exhibits no distension.  Musculoskeletal: Normal range of motion.  Lymphadenopathy:       Head (right side): No submental, no submandibular, no tonsillar, no preauricular, no posterior auricular and no occipital adenopathy present.       Head (left side): No submental, no submandibular, no tonsillar, no preauricular, no posterior auricular and no occipital adenopathy present.    He has cervical adenopathy.       Right cervical: Superficial cervical adenopathy present. No deep cervical and no posterior cervical adenopathy present.      Left cervical: No superficial cervical, no deep cervical and no posterior cervical adenopathy present.       Right axillary: No pectoral and no lateral adenopathy present.       Left axillary: No pectoral and no lateral adenopathy present. Neurological: He is alert and oriented to person, place, and time. No cranial nerve deficit.  Skin: Skin is warm and dry.  Psychiatric: He has a normal mood and affect.    ED Course  Procedures (including critical care time)  Labs Reviewed - No data to display No  results found.   1. Lymph edema       MDM  R anterior painless mobile cervical lymph node swelling with no source.  Swallowing and speaking ok. Started clindamycin.  Will get a pcp to follow up with.  Return for voice changes or difficulty swallowing/breathing.         Jethro Bastos, NP 02/15/11 1312  Jethro Bastos, NP 02/18/11 1744

## 2011-02-19 NOTE — ED Provider Notes (Signed)
Medical screening examination/treatment/procedure(s) were conducted as a shared visit with non-physician practitioner(s) and myself.  I personally evaluated the patient during the encounter  I specifically told patient that if this does not improve with antibiotics that he would need cancer evaluation, pt understands this  Joya Gaskins, MD 02/19/11 [phone]

## 2011-06-10 ENCOUNTER — Encounter (HOSPITAL_COMMUNITY): Payer: Self-pay

## 2011-06-10 ENCOUNTER — Other Ambulatory Visit: Payer: Self-pay

## 2011-06-10 ENCOUNTER — Observation Stay (HOSPITAL_COMMUNITY)
Admission: EM | Admit: 2011-06-10 | Discharge: 2011-06-13 | Disposition: A | Discharge status: Home / Self Care | Payer: Self-pay | Attending: Internal Medicine | Admitting: Internal Medicine

## 2011-06-10 ENCOUNTER — Emergency Department (HOSPITAL_COMMUNITY): Payer: Self-pay

## 2011-06-10 DIAGNOSIS — Z860100 Personal history of colon polyps, unspecified: Secondary | ICD-10-CM | POA: Insufficient documentation

## 2011-06-10 DIAGNOSIS — F17200 Nicotine dependence, unspecified, uncomplicated: Secondary | ICD-10-CM | POA: Insufficient documentation

## 2011-06-10 DIAGNOSIS — R079 Chest pain, unspecified: Secondary | ICD-10-CM

## 2011-06-10 DIAGNOSIS — F319 Bipolar disorder, unspecified: Secondary | ICD-10-CM | POA: Insufficient documentation

## 2011-06-10 DIAGNOSIS — I10 Essential (primary) hypertension: Secondary | ICD-10-CM | POA: Diagnosis present

## 2011-06-10 DIAGNOSIS — D509 Iron deficiency anemia, unspecified: Secondary | ICD-10-CM | POA: Insufficient documentation

## 2011-06-10 HISTORY — DX: Unspecified osteoarthritis, unspecified site: M19.90

## 2011-06-10 LAB — CARDIAC PANEL(CRET KIN+CKTOT+MB+TROPI)
CK, MB: 2.1 ng/mL (ref 0.3–4.0)
Troponin I: <0.3 ng/mL (ref <0.30)

## 2011-06-10 LAB — CBC
HCT: 33.1 % — ABNORMAL LOW (ref 39.0–52.0)
HCT: 35.3 % — ABNORMAL LOW (ref 39.0–52.0)
Hemoglobin: 10.4 g/dL — ABNORMAL LOW (ref 13.0–17.0)
Hemoglobin: 11 g/dL — ABNORMAL LOW (ref 13.0–17.0)
MCH: 23.4 pg — ABNORMAL LOW (ref 26.0–34.0)
MCH: 23.5 pg — ABNORMAL LOW (ref 26.0–34.0)
MCHC: 31.4 g/dL (ref 30.0–36.0)
MCHC: 31.2 g/dL (ref 30.0–36.0)
MCV: 74.4 fL — ABNORMAL LOW (ref 78.0–100.0)
MCV: 75.3 fL — ABNORMAL LOW (ref 78.0–100.0)
RBC: 4.45 MIL/uL (ref 4.22–5.81)

## 2011-06-10 LAB — BASIC METABOLIC PANEL
BUN: 15 mg/dL (ref 6–23)
Creatinine, Ser: 0.94 mg/dL (ref 0.50–1.35)
GFR calc Af Amer: >90 mL/min (ref >90)
GFR calc non Af Amer: >90 mL/min (ref >90)
Potassium: 3.9 mEq/L (ref 3.5–5.1)

## 2011-06-10 LAB — TROPONIN I: Troponin I: <0.3 ng/mL (ref <0.30)

## 2011-06-10 LAB — POCT I-STAT TROPONIN I: Troponin i, poc: 0 ng/mL (ref 0.00–0.08)

## 2011-06-10 MED ORDER — ONDANSETRON HCL 4 MG PO TABS: 4.0000 mg | ORAL_TABLET | Freq: Four times a day (QID) | ORAL | Status: DC | PRN

## 2011-06-10 MED ORDER — HYDROCODONE-ACETAMINOPHEN 5-325 MG PO TABS
1.0000 | ORAL_TABLET | ORAL | Status: DC | PRN
  Administered 2011-06-10 – 2011-06-11 (×2): 2 via ORAL
  Filled 2011-06-10 (×2): qty 2

## 2011-06-10 MED ORDER — SODIUM CHLORIDE 0.9 % IJ SOLN: 3.0000 mL | INTRAMUSCULAR | Status: DC | PRN

## 2011-06-10 MED ORDER — ENOXAPARIN SODIUM 40 MG/0.4ML ~~LOC~~ SOLN
40.0000 mg | SUBCUTANEOUS | Status: DC
  Administered 2011-06-10 – 2011-06-12 (×3): 40 mg via SUBCUTANEOUS
  Filled 2011-06-10 (×4): qty 0.4

## 2011-06-10 MED ORDER — SODIUM CHLORIDE 0.9 % IJ SOLN
3.0000 mL | Freq: Two times a day (BID) | INTRAMUSCULAR | Status: DC
  Administered 2011-06-10: 3 mL via INTRAVENOUS

## 2011-06-10 MED ORDER — SIMVASTATIN 20 MG PO TABS
20.0000 mg | ORAL_TABLET | Freq: Every day | ORAL | Status: DC
  Filled 2011-06-10: qty 1

## 2011-06-10 MED ORDER — MORPHINE SULFATE 4 MG/ML IJ SOLN
INTRAMUSCULAR | Status: AC
  Filled 2011-06-10: qty 1

## 2011-06-10 MED ORDER — MORPHINE SULFATE 2 MG/ML IJ SOLN: 1.0000 mg | INTRAMUSCULAR | Status: DC | PRN

## 2011-06-10 MED ORDER — SODIUM CHLORIDE 0.9 % IV SOLN: 250.0000 mL | INTRAVENOUS | Status: DC | PRN

## 2011-06-10 MED ORDER — ONDANSETRON HCL 4 MG/2ML IJ SOLN: 4.0000 mg | Freq: Four times a day (QID) | INTRAMUSCULAR | Status: DC | PRN

## 2011-06-10 MED ORDER — SENNA 8.6 MG PO TABS
1.0000 | ORAL_TABLET | Freq: Two times a day (BID) | ORAL | Status: DC
  Filled 2011-06-10 (×3): qty 1

## 2011-06-10 MED ORDER — METOPROLOL TARTRATE 12.5 MG HALF TABLET
12.5000 mg | ORAL_TABLET | Freq: Two times a day (BID) | ORAL | Status: DC
  Administered 2011-06-10: 12.5 mg via ORAL
  Filled 2011-06-10 (×3): qty 1

## 2011-06-10 MED ORDER — ONDANSETRON HCL 4 MG/2ML IJ SOLN
4.0000 mg | Freq: Once | INTRAMUSCULAR | Status: AC
  Administered 2011-06-10: 4 mg via INTRAVENOUS
  Filled 2011-06-10: qty 2

## 2011-06-10 MED ORDER — CLOPIDOGREL BISULFATE 75 MG PO TABS
75.0000 mg | ORAL_TABLET | Freq: Every day | ORAL | Status: DC
  Administered 2011-06-11: 75 mg via ORAL
  Filled 2011-06-10 (×2): qty 1

## 2011-06-10 MED ORDER — MORPHINE SULFATE 4 MG/ML IJ SOLN
6.0000 mg | Freq: Once | INTRAMUSCULAR | Status: AC
  Administered 2011-06-10: 6 mg via INTRAVENOUS
  Filled 2011-06-10: qty 1

## 2011-06-10 MED ORDER — ACETAMINOPHEN 325 MG PO TABS: 650.0000 mg | ORAL_TABLET | Freq: Four times a day (QID) | ORAL | Status: DC | PRN

## 2011-06-10 MED ORDER — ZOLPIDEM TARTRATE 5 MG PO TABS: 5.0000 mg | ORAL_TABLET | Freq: Every evening | ORAL | Status: DC | PRN

## 2011-06-10 MED ORDER — NITROGLYCERIN 0.4 MG SL SUBL: 0.4000 mg | SUBLINGUAL_TABLET | SUBLINGUAL | Status: DC | PRN

## 2011-06-10 MED ORDER — ACETAMINOPHEN 650 MG RE SUPP: 650.0000 mg | Freq: Four times a day (QID) | RECTAL | Status: DC | PRN

## 2011-06-10 NOTE — ED Notes (Signed)
Pt  Says he has rt side chest pain.  His main concern is that he wants food.  Sinus brady regular rhy

## 2011-06-10 NOTE — ED Notes (Signed)
Tray ordered.
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
## 2011-06-10 NOTE — ED Notes (Signed)
Pt in radiology 

## 2011-06-10 NOTE — ED Notes (Signed)
Pt upset with blood sticks and md at bedside to speak with pt.

## 2011-06-10 NOTE — ED Notes (Signed)
Pt complains of chest pain and right arm pulsating pain, 9/10 pain[place]

## 2011-06-10 NOTE — ED Notes (Signed)
Attempt to call report nurse not available 

## 2011-06-10 NOTE — ED Provider Notes (Signed)
History     CSN: [id number]  Arrival date & time 06/10/11  1225   First MD Initiated Contact with Patient 06/10/11 1238      Chief Complaint  Patient presents with  . Chest Pain    (Consider location/radiation/quality/duration/timing/severity/associated sxs/prior treatment) HPI  Patient presents to the ED by EMS with complaints of chest pains 9/10. He has an allergy to aspirin therefore was not given any. He was given 2 SL nitro which provided him no relief. He states that the pain is a pressure and sharp, started gradually and will not go away. He thinks he may have had a Cath a year ago but is not sure. He says he was never told about the results. He has a history of hypertension. He is a poor historian to family history Patient vital signs stable NAD.   Past Medical History  Diagnosis Date  . Hypertension     History reviewed. No pertinent past surgical history.  Family History  Problem Relation Age of Onset  . Heart failure Mother   . Hypertension Father   . Parkinsonism Father     History  Substance Use Topics  . Smoking status: Current Everyday Smoker -- 0.5 packs/day  . Smokeless tobacco: Not on file  . Alcohol Use: Yes     casual      Review of Systems   HEENT: denies blurry vision or change in hearing PULMONARY: Denies difficulty breathing and SOB MUSCULOSKELETAL:  denies being unable to ambulate ABDOMEN AL: denies abdominal pain GU: denies loss of bowel or urinary control NEURO: denies numbness and tingling in extremities SKIN: no new rashes PSYCH: patient behavior is normal NECK: Not complaining of neck pain     Allergies  Aspirin and Indocin  Home Medications   No current outpatient prescriptions on file.  BP 146/91  Pulse 52  Temp(Src) 98.5 F (36.9 C) (Oral)  Resp 18  Ht 5\' 10"  (1.778 m)  Wt 181 lb 3.5 oz (82.2 kg)  BMI 26.00 kg/m2  SpO2 100%  Physical Exam  Nursing note and vitals reviewed. Constitutional: He appears  well-developed and well-nourished. No distress.  HENT:  Head: Normocephalic and atraumatic.  Eyes: Pupils are equal, round, and reactive to light.  Neck: Normal range of motion. Neck supple.  Cardiovascular: Normal rate, regular rhythm and normal heart sounds.   Pulmonary/Chest: Effort normal. No respiratory distress. He has no wheezes. He has no rales.  Abdominal: Soft.  Neurological: He is alert.  Skin: Skin is warm and dry.    ED Course  Procedures (including critical care time)  Labs Reviewed  CBC - Abnormal; Notable for the following:    Hemoglobin 10.4 (*)    HCT 33.1 (*)    MCV 74.4 (*)    MCH 23.4 (*)    RDW 23.2 (*)    All other components within normal limits  D-DIMER, QUANTITATIVE - Abnormal; Notable for the following:    D-Dimer, Quant 0.51 (*)    All other components within normal limits  CBC - Abnormal; Notable for the following:    Hemoglobin 11.0 (*)    HCT 35.3 (*)    MCV 75.3 (*)    MCH 23.5 (*)    RDW 23.6 (*)    All other components within normal limits  CREATININE, SERUM - Abnormal; Notable for the following:    GFR calc non Af Amer 75 (*)    GFR calc Af Amer 87 (*)    All other components  within normal limits  HEMOGLOBIN A1C - Abnormal; Notable for the following:    Hemoglobin A1C 5.7 (*)    Mean Plasma Glucose 117 (*)    All other components within normal limits  CBC - Abnormal; Notable for the following:    Hemoglobin 10.6 (*)    HCT 34.2 (*)    MCV 74.8 (*)    MCH 23.2 (*)    RDW 23.0 (*)    All other components within normal limits  COMPREHENSIVE METABOLIC PANEL - Abnormal; Notable for the following:    Albumin 3.0 (*)    Alkaline Phosphatase 35 (*)    Total Bilirubin 0.1 (*)    GFR calc non Af Amer 70 (*)    GFR calc Af Amer 82 (*)    All other components within normal limits  DRUGS OF ABUSE SCREEN W/O ALC, ROUTINE URINE - Abnormal; Notable for the following:    Opiate Screen, Urine POSITIVE (*)    All other components within normal  limits  LIPID PANEL - Abnormal; Notable for the following:    HDL 29 (*)    All other components within normal limits  VITAMIN B12 - Abnormal; Notable for the following:    Vitamin B-12 175 (*)    All other components within normal limits  IRON AND TIBC - Abnormal; Notable for the following:    Saturation Ratios 18 (*)    All other components within normal limits  FERRITIN - Abnormal; Notable for the following:    Ferritin 19 (*)    All other components within normal limits  TROPONIN I  BASIC METABOLIC PANEL  POCT I-STAT TROPONIN I  MRSA PCR SCREENING  CARDIAC PANEL(CRET KIN+CKTOT+MB+TROPI)  CARDIAC PANEL(CRET KIN+CKTOT+MB+TROPI)  FOLATE  RETICULOCYTES   No results found.   1. Chest pain       MDM  Patients pain controlled with Morphine, after nitro SL did not alleviate pain. No aspirin given due to aspirin allergy.   First Trop is normal, normal EKG.   Dr. Bebe Shaggy has seen patient as well and believes that the patient needs to be admitted for cardiac r/o. The patient remains in no acute distress and chest pain free at the time of admition.    [facility]         Dorthula Matas, PA 06/13/11 [phone]

## 2011-06-10 NOTE — ED Provider Notes (Signed)
ECG shows normal sinus rhythm with a rate of 66, no ectopy. Normal axis. Normal P wave. Normal QRS. Normal intervals. Normal ST and T waves. Impression: normal ECG. No old ECG available for comparison.   Dione Booze, MD 06/10/11 1248

## 2011-06-10 NOTE — ED Notes (Signed)
First contact with pt. Pt placed on monitors. Pt rates c/p 9/10 and requesting dinner. No facial grimacing noted, pt in relaxed position, and watching TV.

## 2011-06-11 DIAGNOSIS — R112 Nausea with vomiting, unspecified: Secondary | ICD-10-CM

## 2011-06-11 DIAGNOSIS — F17200 Nicotine dependence, unspecified, uncomplicated: Secondary | ICD-10-CM

## 2011-06-11 DIAGNOSIS — I10 Essential (primary) hypertension: Secondary | ICD-10-CM

## 2011-06-11 DIAGNOSIS — R079 Chest pain, unspecified: Secondary | ICD-10-CM

## 2011-06-11 LAB — IRON AND TIBC
Iron: 55 ug/dL (ref 42–135)
Saturation Ratios: 18 % — ABNORMAL LOW (ref 20–55)
TIBC: 309 ug/dL (ref 215–435)
UIBC: 254 ug/dL (ref 125–400)

## 2011-06-11 LAB — DRUGS OF ABUSE SCREEN W/O ALC, ROUTINE URINE
Creatinine,U: 144.2 mg/dL
Marijuana Metabolite: NEGATIVE
Methadone: NEGATIVE
Opiate Screen, Urine: POSITIVE — AB
Propoxyphene: NEGATIVE

## 2011-06-11 LAB — COMPREHENSIVE METABOLIC PANEL
Alkaline Phosphatase: 35 U/L — ABNORMAL LOW (ref 39–117)
BUN: 16 mg/dL (ref 6–23)
CO2: 28 mEq/L (ref 19–32)
GFR calc Af Amer: 82 mL/min — ABNORMAL LOW (ref >90)
GFR calc non Af Amer: 70 mL/min — ABNORMAL LOW (ref >90)
Glucose, Bld: 86 mg/dL (ref 70–99)
Potassium: 4.1 mEq/L (ref 3.5–5.1)
Total Protein: 6.2 g/dL (ref 6.0–8.3)

## 2011-06-11 LAB — CARDIAC PANEL(CRET KIN+CKTOT+MB+TROPI)
CK, MB: 2 ng/mL (ref 0.3–4.0)
Total CK: 88 U/L (ref 7–232)

## 2011-06-11 LAB — LIPID PANEL
Cholesterol: 116 mg/dL (ref 0–200)
Triglycerides: 82 mg/dL (ref <150)
VLDL: 16 mg/dL (ref 0–40)

## 2011-06-11 LAB — FERRITIN: Ferritin: 19 ng/mL — ABNORMAL LOW (ref 22–322)

## 2011-06-11 LAB — CBC
HCT: 34.2 % — ABNORMAL LOW (ref 39.0–52.0)
Hemoglobin: 10.6 g/dL — ABNORMAL LOW (ref 13.0–17.0)
MCH: 23.2 pg — ABNORMAL LOW (ref 26.0–34.0)
MCHC: 31 g/dL (ref 30.0–36.0)

## 2011-06-11 LAB — FOLATE: Folate: 14 ng/mL

## 2011-06-11 MED ORDER — SELENIUM SULFIDE 1 % EX LOTN
TOPICAL_LOTION | Freq: Every day | CUTANEOUS | Status: DC | PRN
  Filled 2011-06-11: qty 207

## 2011-06-11 MED ORDER — BISACODYL 5 MG PO TBEC
10.0000 mg | DELAYED_RELEASE_TABLET | Freq: Once | ORAL | Status: AC
  Administered 2011-06-12: 10 mg via ORAL
  Filled 2011-06-11: qty 2

## 2011-06-11 MED ORDER — PEG 3350-KCL-NA BICARB-NACL 420 G PO SOLR
4000.0000 mL | Freq: Once | ORAL | Status: AC
  Administered 2011-06-12: 4000 mL via ORAL
  Filled 2011-06-11: qty 4000

## 2011-06-11 MED ORDER — SELENIUM SULFIDE 2.5 % EX LOTN
1.0000 "application " | TOPICAL_LOTION | Freq: Every day | CUTANEOUS | Status: DC | PRN
  Filled 2011-06-11: qty 120

## 2011-06-11 MED ORDER — HYDROCHLOROTHIAZIDE 25 MG PO TABS
25.0000 mg | ORAL_TABLET | Freq: Every day | ORAL | Status: DC
  Administered 2011-06-11 – 2011-06-13 (×3): 25 mg via ORAL
  Filled 2011-06-11 (×3): qty 1

## 2011-06-11 MED ORDER — PEG 3350-KCL-NA BICARB-NACL 420 G PO SOLR
2000.0000 mL | Freq: Once | ORAL | Status: AC | PRN
  Administered 2011-06-12: 2000 mL via ORAL
  Filled 2011-06-11: qty 4000

## 2011-06-11 NOTE — Consult Note (Signed)
[facility] Consultation Note  Referring Provider: Dr. Butler [facility])  Reason for Consultation:  Chest pain, blood in stool, microcytic anemia  HPI: [name] is a 56 y.o. male admitted for chest pain.  Cardiac evaluation negative.  As part of evaluation, found to have microcytic anemia.  Has had intermittent bouts of hematochezia over the past several years.  Had colonoscopy in [place] about 7 years ago which showed some polyps per patient.  He has no abdominal pain, change in bowel habits, loss-of-appetite, GERD, dysphagia, weight loss.  Denies regular use of NSAIDs.  No prior endoscopy.   Past Medical History  Diagnosis Date  . Hypertension     History reviewed. No pertinent past surgical history.  Prior to Admission medications   Medication Sig Start Date End Date Taking? Authorizing Provider  hydrochlorothiazide (HYDRODIURIL) 25 MG tablet Take 25 mg by mouth daily.   Yes Historical Provider, MD    Current Facility-Administered Medications  Medication Dose Route Frequency Provider Last Rate Last Dose  . acetaminophen (TYLENOL) tablet 650 mg  650 mg Oral Q6H PRN Shanker Levora Dredge, MD       Or  . acetaminophen (TYLENOL) suppository 650 mg  650 mg Rectal Q6H PRN Maretta Bees, MD      . enoxaparin (LOVENOX) injection 40 mg  40 mg Subcutaneous Q24H Maretta Bees, MD   40 mg at 06/10/11 2005  . hydrochlorothiazide (HYDRODIURIL) tablet 25 mg  25 mg Oral Daily Calvert Cantor, MD   25 mg at 06/11/11 1014  . morphine 4 MG/ML injection 6 mg  6 mg Intravenous Once Dorthula Matas, PA   6 mg at 06/10/11 1413  . ondansetron (ZOFRAN) injection 4 mg  4 mg Intravenous Once Dorthula Matas, PA   4 mg at 06/10/11 1417  . ondansetron (ZOFRAN) tablet 4 mg  4 mg Oral Q6H PRN Shanker Levora Dredge, MD       Or  . ondansetron (ZOFRAN) injection 4 mg  4 mg Intravenous Q6H PRN Shanker Levora Dredge, MD      . selenium sulfide (SELSUN) 1 % shampoo   Topical Daily PRN Calvert Cantor, MD        . zolpidem (AMBIEN) tablet 5 mg  5 mg Oral QHS PRN Shanker Levora Dredge, MD      . DISCONTD: 0.9 %  sodium chloride infusion  250 mL Intravenous PRN Shanker Levora Dredge, MD      . DISCONTD: clopidogrel (PLAVIX) tablet 75 mg  75 mg Oral Q breakfast Maretta Bees, MD   75 mg at 06/11/11 0651  . DISCONTD: HYDROcodone-acetaminophen (NORCO) 5-325 MG per tablet 1-2 tablet  1-2 tablet Oral Q4H PRN Maretta Bees, MD   2 tablet at 06/11/11 [phone]  . DISCONTD: metoprolol tartrate (LOPRESSOR) tablet 12.5 mg  12.5 mg Oral BID Maretta Bees, MD   12.5 mg at 06/10/11 2226  . DISCONTD: morphine 2 MG/ML injection 1 mg  1 mg Intravenous Q4H PRN Shanker Levora Dredge, MD      . DISCONTD: nitroGLYCERIN (NITROSTAT) SL tablet 0.4 mg  0.4 mg Sublingual Q5 Min x 3 PRN Shanker Levora Dredge, MD      . DISCONTD: selenium sulfide (SELSUN) 2.5 % shampoo 1 application  1 application Topical Daily PRN Calvert Cantor, MD      . DISCONTD: senna (SENOKOT) tablet 8.6 mg  1 tablet Oral BID Shanker Levora Dredge, MD      . DISCONTD: simvastatin (ZOCOR) tablet 20  mg  20 mg Oral q1800 Maretta Bees, MD      . DISCONTD: sodium chloride 0.9 % injection 3 mL  3 mL Intravenous Q12H Maretta Bees, MD   3 mL at 06/10/11 2237  . DISCONTD: sodium chloride 0.9 % injection 3 mL  3 mL Intravenous PRN Maretta Bees, MD        Allergies as of 06/10/2011 - Review Complete 06/10/2011  Allergen Reaction Noted  . Aspirin Hives 11/11/2010  . Indocin (indomethacin) Swelling 11/22/2010    Family History  Problem Relation Age of Onset  . Heart failure Mother   . Hypertension Father   . Parkinsonism Father     History   Social History  . Marital Status: Widowed    Spouse Name: N/A    Number of Children: N/A  . Years of Education: N/A   Occupational History  . Not on file.   Social History Main Topics  . Smoking status: Current Everyday Smoker -- 0.5 packs/day  . Smokeless tobacco: Not on file  . Alcohol Use: Yes     casual  .  Drug Use: No  . Sexually Active:    Other Topics Concern  . Not on file   Social History Narrative  . No narrative on file    Review of Systems: Positive = bold Gen: Denies any fever, chills, rigors, night sweats, anorexia, fatigue, weakness, malaise, involuntary weight loss, and sleep disorder CV: Denies chest pain, angina, palpitations, syncope, orthopnea, PND, peripheral edema, and claudication. Resp: Denies dyspnea, cough, sputum, wheezing, coughing up blood. GI: Described in detail in HPI.    GU : Denies urinary burning, blood in urine, urinary frequency, urinary hesitancy, nocturnal urination, and urinary incontinence. MS: Denies joint pain or swelling.  Denies muscle weakness, cramps, atrophy.  Derm: Denies rash, itching, oral ulcerations, hives, unhealing ulcers.  Psych: Denies depression, anxiety, memory loss, suicidal ideation, hallucinations,  and confusion. Heme: Denies bruising, bleeding, and enlarged lymph nodes. Neuro:  Denies any headaches, dizziness, paresthesias. Endo:  Denies any problems with DM, thyroid, adrenal function.  Physical Exam: Vital signs in last 24 hours: Temp:  [96.5 F (35.8 C)-98.3 F (36.8 C)] 97.8 F (36.6 C) (06/08 0751) Pulse Rate:  [49-68] 49  (06/08 0900) Resp:  [10-22] 13  (06/08 0900) BP: (112-133)/(66-96) 130/81 mmHg (06/08 0900) SpO2:  [97 %-100 %] 97 % (06/08 0900) Weight:  [85.73 kg (189 lb)-86 kg (189 lb 9.5 oz)] 86 kg (189 lb 9.5 oz) (06/08 0440) Last BM Date: 06/10/11 General:   Alert,  Well-developed, well-nourished, pleasant and cooperative in NAD Head:  Normocephalic and atraumatic. Eyes:  Sclera clear, no icterus.   Conjunctiva slightly pale. Ears:  Normal auditory acuity. Nose:  No deformity, discharge,  or lesions. Mouth:  No deformity or lesions.  Fair dentition.  Oropharynx pink & moist. Neck:  Supple; no masses or thyromegaly. Lungs:  Clear throughout to auscultation.   No wheezes, crackles, or rhonchi. No acute  distress. Heart:  Regular rate and rhythm; no murmurs, clicks, rubs,  or gallops. Abdomen:  Soft, nontender and nondistended. No masses, hepatosplenomegaly or hernias noted. Normal bowel sounds, without guarding, and without rebound.     Msk:  Chest pain reproducible upon palpation.  Symmetrical without gross deformities. Normal posture. Pulses:  Normal pulses noted. Extremities:  Without clubbing or edema. Neurologic:  Alert and  oriented x4;  grossly normal neurologically. Skin:  Intact without significant lesions or rashes. Psych:  Alert and cooperative. Normal  mood and affect.   Lab Results:  Basename 06/11/11 0249 06/10/11 1844 06/10/11 1433  WBC 6.1 7.1 6.5  HGB 10.6* 11.0* 10.4*  HCT 34.2* 35.3* 33.1*  PLT 366 353 368   BMET  Basename 06/11/11 0249 06/10/11 1844 06/10/11 1437  NA 141 -- 137  K 4.1 -- 3.9  CL 104 -- 102  CO2 28 -- 26  GLUCOSE 86 -- 97  BUN 16 -- 15  CREATININE 1.15 1.09 0.94  CALCIUM 8.7 -- 8.4   LFT  Basename 06/11/11 0249  PROT 6.2  ALBUMIN 3.0*  AST 12  ALT 9  ALKPHOS 35*  BILITOT 0.1*  BILIDIR --  IBILI --   PT/INR No results found for this basename: LABPROT:2,INR:2 in the last 72 hours  Studies/Results: Dg Chest 2 View  06/10/2011  *RADIOLOGY REPORT*  Clinical Data: Mid chest pain and shortness of breath, cough, smoking history  CHEST - 2 VIEW  Comparison: None.  Findings: The lungs are clear.  There is mild cardiomegaly present. No bony abnormality is seen.  IMPRESSION: No active lung disease.  Mild cardiomegaly.  Original Report Authenticated By: Juline Patch, M.D.   Impression:  1.  Microcytic anemia. 2.  Hematochezia. 3.  Personal history of colon polyps. 4.  Chest pain.  Seemingly musculoskeletal in origin.  Cardiac evaluation negative.  Plan:  1.  Agree with anemia studies, especially iron panel + ferritin. 2.  Endoscopy and colonoscopy for evaluation of anemia and chest pain.  I have given patient option to have these  tests done as outpatient.  Patient would like these studies done while he is still hospitalized.  Accordingly, will plan on doing them Monday.   LOS: 1 day   Zaray Gatchel M  06/11/2011, 12:16 PM

## 2011-06-11 NOTE — Progress Notes (Signed)
[facility]  Subjective: Still having left chest pain 4-5/10. Right arm is in some pain as well. Pain is reproducible I have discussed his anemia and he admits to me that he had bright red blood per rectum "all night" a few months ago and has had a colonoscopy 5 yrs ago with polypectomies (in [place]). He occasionally feels light headed. C/o severe itching of scalp over past few months with dandruff. Has been scratching and at times noticed a small amount of pus.   Objective: Blood pressure 123/80, pulse 56, temperature 97.8 F (36.6 C), temperature source Oral, resp. rate 12, height 5\' 10"  (1.778 m), weight 86 kg (189 lb 9.5 oz), SpO2 98.00%. Weight change:   Intake/Output Summary (Last 24 hours) at 06/11/11 0454 Last data filed at 06/11/11 0981  Gross per 24 hour  Intake    483 ml  Output    725 ml  Net   -242 ml    Physical Exam: General appearance: alert, cooperative and no distress Head: right post scalp- few small hypopigmented lesions noted - possibly recent injury. No fluctuance noted that would be consistent with abscess, currently no pustules or discharge. Throat: lips, mucosa, and tongue normal; teeth and gums normal Lungs: clear to auscultation bilaterally Chest wall: left sided chest wall tenderness Heart: regular rate and rhythm, S1, S2 normal, no murmur, click Abdomen: soft, non-tender; bowel sounds normal; no masses,  no organomegaly Extremities: extremities normal, atraumatic, no cyanosis or edema  Lab Results:  Basename 06/11/11 0249 06/10/11 1844 06/10/11 1437  NA 141 -- 137  K 4.1 -- 3.9  CL 104 -- 102  CO2 28 -- 26  GLUCOSE 86 -- 97  BUN 16 -- 15  CREATININE 1.15 1.09 --  CALCIUM 8.7 -- 8.4  MG -- -- --  PHOS -- -- --    Basename 06/11/11 0249  AST 12  ALT 9  ALKPHOS 35*  BILITOT 0.1*  PROT 6.2  ALBUMIN 3.0*   No results found for this basename: LIPASE:2,AMYLASE:2 in the last 72 hours  Basename 06/11/11 0249 06/10/11 1844  WBC 6.1  7.1  NEUTROABS -- --  HGB 10.6* 11.0*  HCT 34.2* 35.3*  MCV 74.8* 75.3*  PLT 366 353    Basename 06/11/11 0249 06/10/11 1844 06/10/11 1436  CKTOTAL 88 116 --  CKMB 2.0 2.1 --  CKMBINDEX -- -- --  TROPONINI <0.30 <0.30 <0.30   No components found with this basename: POCBNP:3  Basename 06/10/11 1436  DDIMER 0.51*   No results found for this basename: HGBA1C:2 in the last 72 hours  Basename 06/11/11 0249  CHOL 116  HDL 29*  LDLCALC 71  TRIG 82  CHOLHDL 4.0  LDLDIRECT --   No results found for this basename: TSH,T4TOTAL,FREET3,T3FREE,THYROIDAB in the last 72 hours No results found for this basename: VITAMINB12:2,FOLATE:2,FERRITIN:2,TIBC:2,IRON:2,RETICCTPCT:2 in the last 72 hours  Micro Results: Recent Results (from the past 240 hour(s))  MRSA PCR SCREENING     Status: Normal   Collection Time   06/10/11  6:39 PM      Component Value Range Status Comment   MRSA by PCR NEGATIVE  NEGATIVE  Final     Studies/Results: Dg Chest 2 View  06/10/2011  *RADIOLOGY REPORT*  Clinical Data: Mid chest pain and shortness of breath, cough, smoking history  CHEST - 2 VIEW  Comparison: None.  Findings: The lungs are clear.  There is mild cardiomegaly present. No bony abnormality is seen.  IMPRESSION: No active lung disease.  Mild  cardiomegaly.  Original Report Authenticated By: Juline Patch, M.D.    Medications: Scheduled Meds:   . enoxaparin  40 mg Subcutaneous Q24H  . hydrochlorothiazide  25 mg Oral Daily  .  morphine injection  6 mg Intravenous Once  . ondansetron  4 mg Intravenous Once  . sodium chloride  3 mL Intravenous Q12H  . DISCONTD: clopidogrel  75 mg Oral Q breakfast  . DISCONTD: metoprolol tartrate  12.5 mg Oral BID  . DISCONTD: senna  1 tablet Oral BID  . DISCONTD: simvastatin  20 mg Oral q1800   Continuous Infusions:  PRN Meds:.sodium chloride, acetaminophen, acetaminophen, HYDROcodone-acetaminophen, morphine injection, nitroGLYCERIN, ondansetron (ZOFRAN) IV,  ondansetron, selenium sulfide, sodium chloride, zolpidem  Assessment/Plan:   Chest pain Seems musculoskeletal - reproducible and constant- has ruled out for MI; EKG was absolutely normal.  Will d/c telemetry. F/u ECHO    *HTN (hypertension) Was on HCTZ in the past but has not [facility]. Will resume this and d/c Metoprolol (bradycardic as well after a small dose)  Microcytic anemia with rectal bleed a few months ago Have discussed possible colonoscopy with patient, he is willing to have it. Have spoked with Dr Dulce Sellar who is agreement with seeing him later today.  Will order and anemia profile Does not endorse frequent NSAID use or ETOH abuse.    Bipolar disorder?  Scalp itch Try selenium sulfide  Nicotine abuse Counseled to stop smoking  Financial issues Need help with ride back to [place] where he is currently residing.  Needs help finding PCP     Code Status: full Family Communication: none needed Disposition: transfer to med-surg- cont anemia w/u, f/u echo for longstanding HTN  Roberta Kelly [phone] 06/11/2011, 9:22 AM  LOS: 1 day

## 2011-06-11 NOTE — Progress Notes (Signed)
06/11/11 1137 Patient transferred from 2600.  He is A & O x 4, ambulatory, and has intact skin.  Oriented to room.

## 2011-06-12 DIAGNOSIS — F17200 Nicotine dependence, unspecified, uncomplicated: Secondary | ICD-10-CM

## 2011-06-12 DIAGNOSIS — R112 Nausea with vomiting, unspecified: Secondary | ICD-10-CM

## 2011-06-12 DIAGNOSIS — R079 Chest pain, unspecified: Secondary | ICD-10-CM

## 2011-06-12 DIAGNOSIS — I10 Essential (primary) hypertension: Secondary | ICD-10-CM

## 2011-06-12 MED ORDER — CYANOCOBALAMIN 1000 MCG/ML IJ SOLN
1000.0000 ug | Freq: Once | INTRAMUSCULAR | Status: AC
  Administered 2011-06-12: 1000 ug via INTRAMUSCULAR
  Filled 2011-06-12: qty 1

## 2011-06-12 NOTE — Progress Notes (Signed)
Subjective: No chest pain at present. No blood in stool.  Objective: Vital signs in last 24 hours: Temp:  [97.5 F (36.4 C)-98.2 F (36.8 C)] 97.5 F (36.4 C) (06/09 0457) Pulse Rate:  [50-71] 53  (06/09 0457) Resp:  [14-18] 18  (06/09 0457) BP: (128-152)/(80-91) 152/91 mmHg (06/09 0457) SpO2:  [97 %-100 %] 99 % (06/09 0457) Weight:  [82.2 kg (181 lb 3.5 oz)] 82.2 kg (181 lb 3.5 oz) (06/08 1223) Weight change: -3.53 kg (-7 lb 12.5 oz) Last BM Date: 06/10/11  PE: GEN:  NAD ABD:  Soft  Lab Results: CBC    Component Value Date/Time   WBC 6.1 06/11/2011 0249   RBC 4.57 06/11/2011 0249   HGB 10.6* 06/11/2011 0249   HCT 34.2* 06/11/2011 0249   PLT 366 06/11/2011 0249   MCV 74.8* 06/11/2011 0249   MCH 23.2* 06/11/2011 0249   MCHC 31.0 06/11/2011 0249   RDW 23.0* 06/11/2011 0249   LYMPHSABS 3.2 10/13/2010 0408   MONOABS 0.7 10/13/2010 0408   EOSABS 0.3 10/13/2010 0408   BASOSABS 0.0 10/13/2010 0408   Iron studies:  Low ferritin and low % iron saturation  Assessment:  1.  Chest pain, resolved.  Seemingly musculoskeletal.  Cardiac evaluation negative. 2.  Iron deficiency anemia. 3.  History intermittent hematochezia.  Plan:  1.  Endoscopy and colonoscopy tomorrow. 2.  Further management pending endoscopic test findings.   [name] 06/12/2011, 11:24 AM

## 2011-06-12 NOTE — Progress Notes (Signed)
PATIENT DETAILS Name: [name] Age: 56 y.o. Sex: male Date of Birth: [birth year] Admit Date: 06/10/2011 ZOX:[email or web address], MD, MD  Subjective: No major complaints Completely chest pain free  Objective: Vital signs in last 24 hours: Filed Vitals:   06/11/11 1223 06/11/11 1300 06/11/11 2231 06/12/11 0457  BP: 139/87 140/80 128/83 152/91  Pulse: 51 50 71 53  Temp: 98.1 F (36.7 C) 98.2 F (36.8 C) 98.2 F (36.8 C) 97.5 F (36.4 C)  TempSrc: Oral Oral Oral Oral  Resp: 14 16 18 18   Height: 5\' 10"  (1.778 m)     Weight: 82.2 kg (181 lb 3.5 oz)     SpO2: 100% 100% 97% 99%    Weight change: -3.53 kg (-7 lb 12.5 oz)  Body mass index is 26.00 kg/(m^2).  Intake/Output from previous day:  Intake/Output Summary (Last 24 hours) at 06/12/11 1153 Last data filed at 06/11/11 2232  Gross per 24 hour  Intake    360 ml  Output      0 ml  Net    360 ml    PHYSICAL EXAM: Gen Exam: Awake and alert with clear speech. Neck: Supple, No JVD.  Chest: B/L Clear.  CVS: S1 S2 Regular, no murmurs.  Abdomen: soft, BS +, non tender, non distended.  Extremities: no edema, lower extremities warm to touch Neurologic: Non Focal.   Skin: No Rash.   Wounds: N/A.    CONSULTS:  GI  LAB RESULTS: CBC  Lab 06/11/11 0249 06/10/11 1844 06/10/11 1433  WBC 6.1 7.1 6.5  HGB 10.6* 11.0* 10.4*  HCT 34.2* 35.3* 33.1*  PLT 366 353 368  MCV 74.8* 75.3* 74.4*  MCH 23.2* 23.5* 23.4*  MCHC 31.0 31.2 31.4  RDW 23.0* 23.6* 23.2*  LYMPHSABS -- -- --  MONOABS -- -- --  EOSABS -- -- --  BASOSABS -- -- --  BANDABS -- -- --    Chemistries   Lab 06/11/11 0249 06/10/11 1844 06/10/11 1437  NA 141 -- 137  K 4.1 -- 3.9  CL 104 -- 102  CO2 28 -- 26  GLUCOSE 86 -- 97  BUN 16 -- 15  CREATININE 1.15 1.09 0.94  CALCIUM 8.7 -- 8.4  MG -- -- --    CBG: No results found for this basename: GLUCAP:5 in the last 168 hours  GFR Estimated Creatinine Clearance: 75.8 ml/min (by C-G formula based on  Cr of 1.15).  Coagulation profile No results found for this basename: INR:5,PROTIME:5 in the last 168 hours  Cardiac Enzymes  Lab 06/11/11 0249 06/10/11 1844 06/10/11 1436  CKMB 2.0 2.1 --  TROPONINI <0.30 <0.30 <0.30  MYOGLOBIN -- -- --    No components found with this basename: POCBNP:3  Basename 06/10/11 1436  DDIMER 0.51*    Basename 06/11/11 0249  HGBA1C 5.7*    Basename 06/11/11 0249  CHOL 116  HDL 29*  LDLCALC 71  TRIG 82  CHOLHDL 4.0  LDLDIRECT --   No results found for this basename: TSH,T4TOTAL,FREET3,T3FREE,THYROIDAB in the last 72 hours  Basename 06/11/11 1003  VITAMINB12 175*  FOLATE 14.0  FERRITIN 19*  TIBC 309  IRON 55  RETICCTPCT 1.2   No results found for this basename: LIPASE:2,AMYLASE:2 in the last 72 hours  Urine Studies No results found for this basename: UACOL:2,UAPR:2,USPG:2,UPH:2,UTP:2,UGL:2,UKET:2,UBIL:2,UHGB:2,UNIT:2,UROB:2,ULEU:2,UEPI:2,UWBC:2,URBC:2,UBAC:2,CAST:2,CRYS:2,UCOM:2,BILUA:2 in the last 72 hours  MICROBIOLOGY: Recent Results (from the past 240 hour(s))  MRSA PCR SCREENING     Status: Normal   Collection Time   06/10/11  6:39 PM  Component Value Range Status Comment   MRSA by PCR NEGATIVE  NEGATIVE  Final     RADIOLOGY STUDIES/RESULTS: Dg Chest 2 View  06/10/2011  *RADIOLOGY REPORT*  Clinical Data: Mid chest pain and shortness of breath, cough, smoking history  CHEST - 2 VIEW  Comparison: None.  Findings: The lungs are clear.  There is mild cardiomegaly present. No bony abnormality is seen.  IMPRESSION: No active lung disease.  Mild cardiomegaly.  Original Report Authenticated By: Juline Patch, M.D.    MEDICATIONS: Scheduled Meds:   . bisacodyl  10 mg Oral Once  . cyanocobalamin  1,000 mcg Intramuscular Once  . enoxaparin  40 mg Subcutaneous Q24H  . hydrochlorothiazide  25 mg Oral Daily  . polyethylene glycol-electrolytes  4,000 mL Oral Once   Continuous Infusions:  PRN Meds:.acetaminophen, acetaminophen,  ondansetron (ZOFRAN) IV, ondansetron, polyethylene glycol-electrolytes, selenium sulfide, zolpidem  Antibiotics: Anti-infectives    None      Assessment/Plan: Principal Problem:  Chest pain  -Seems musculoskeletal - reproducible and constant- has ruled out for MI; EKG was absolutely normal.  - has now completely resolved  *HTN (hypertension)  -moderate control with HCTZ   Microcytic anemia with rectal bleed a few months ago  -for EGD/Colonoscopy 6/10  Bipolar disorder?  -not on any meds -mood stable   Scalp itch  Try selenium sulfide   Nicotine abuse  Counseled to stop smoking   Disposition: Remain inpatient  DVT Prophylaxis: Prophylactic Lovenox  Code Status: Full Code  Jeoffrey Massed, MD  [facility] Pager [phone]  If 7PM-7AM, please contact night-coverage [email or web address] Password [email or web address] 06/12/2011, 11:53 AM   LOS: 2 days

## 2011-06-13 ENCOUNTER — Encounter (HOSPITAL_COMMUNITY): Payer: Self-pay | Admitting: *Deleted

## 2011-06-13 ENCOUNTER — Encounter (HOSPITAL_COMMUNITY)
Admission: EM | Disposition: A | Discharge status: Home / Self Care | Payer: Self-pay | Source: Home / Self Care | Attending: Emergency Medicine

## 2011-06-13 DIAGNOSIS — R079 Chest pain, unspecified: Secondary | ICD-10-CM

## 2011-06-13 DIAGNOSIS — R112 Nausea with vomiting, unspecified: Secondary | ICD-10-CM

## 2011-06-13 DIAGNOSIS — F17200 Nicotine dependence, unspecified, uncomplicated: Secondary | ICD-10-CM

## 2011-06-13 DIAGNOSIS — I10 Essential (primary) hypertension: Secondary | ICD-10-CM

## 2011-06-13 HISTORY — PX: COLONOSCOPY: SHX5424

## 2011-06-13 HISTORY — PX: ESOPHAGOGASTRODUODENOSCOPY: SHX5428

## 2011-06-13 SURGERY — EGD (ESOPHAGOGASTRODUODENOSCOPY)
Anesthesia: Moderate Sedation

## 2011-06-13 MED ORDER — HYDROCHLOROTHIAZIDE 25 MG PO TABS: 25.0000 mg | ORAL_TABLET | Freq: Every day | ORAL | Status: DC

## 2011-06-13 MED ORDER — BUTAMBEN-TETRACAINE-BENZOCAINE 2-2-14 % EX AERO
INHALATION_SPRAY | CUTANEOUS | Status: DC | PRN
  Administered 2011-06-13: 2 via TOPICAL

## 2011-06-13 MED ORDER — DIPHENHYDRAMINE HCL 50 MG/ML IJ SOLN
INTRAMUSCULAR | Status: AC
  Filled 2011-06-13: qty 1

## 2011-06-13 MED ORDER — MIDAZOLAM HCL 10 MG/2ML IJ SOLN
INTRAMUSCULAR | Status: DC | PRN
  Administered 2011-06-13: 2 mg via INTRAVENOUS
  Administered 2011-06-13 (×2): 1 mg via INTRAVENOUS
  Administered 2011-06-13 (×2): 2 mg via INTRAVENOUS
  Administered 2011-06-13: 1 mg via INTRAVENOUS

## 2011-06-13 MED ORDER — FENTANYL NICU IV SYRINGE 50 MCG/ML
INJECTION | INTRAMUSCULAR | Status: DC | PRN
  Administered 2011-06-13 (×5): 25 ug via INTRAVENOUS

## 2011-06-13 MED ORDER — CYANOCOBALAMIN 1000 MCG/ML IJ SOLN
1000.0000 ug | Freq: Once | INTRAMUSCULAR | Status: AC
  Administered 2011-06-13: 1000 ug via INTRAMUSCULAR
  Filled 2011-06-13: qty 1

## 2011-06-13 MED ORDER — SODIUM CHLORIDE 0.9 % IV SOLN
Freq: Once | INTRAVENOUS | Status: AC
  Administered 2011-06-13: 500 mL via INTRAVENOUS

## 2011-06-13 MED ORDER — CYANOCOBALAMIN 100 MCG PO TABS: 100.0000 ug | ORAL_TABLET | Freq: Every day | ORAL | Status: DC

## 2011-06-13 MED ORDER — MIDAZOLAM HCL 10 MG/2ML IJ SOLN
INTRAMUSCULAR | Status: AC
  Filled 2011-06-13: qty 4

## 2011-06-13 MED ORDER — VITAMIN B-12 100 MCG PO TABS: 100.0000 ug | ORAL_TABLET | Freq: Every day | ORAL | Status: DC

## 2011-06-13 MED ORDER — FENTANYL CITRATE 0.05 MG/ML IJ SOLN
INTRAMUSCULAR | Status: AC
  Filled 2011-06-13: qty 4

## 2011-06-13 NOTE — Op Note (Signed)
[name] [address] [place], [place]  [address]  COLONOSCOPY PROCEDURE REPORT  PATIENT:  [name], [name]  MR#:  [id number] BIRTHDATE:  05/22/1955, 56 yrs. old  GENDER:  male ENDOSCOPIST:  Willis Modena, MD REF. BY:  Dr. Jerral [facility]) PROCEDURE DATE:  06/13/2011 PROCEDURE:  Colonoscopy 19147 ASA CLASS:  Class II INDICATIONS:  hematochezia, iron-deficiency anemia, personal history of colon polyps MEDICATIONS:   Fentanyl 125 mcg IV, Versed 8 mg IV  DESCRIPTION OF PROCEDURE:   After the risks benefits and alternatives of the procedure were thoroughly explained, informed consent was obtained.  No rectal exam performed. The Pentax Ped Colon [id number] endoscope was introduced through the anus and advanced to the cecum, which was identified by both the appendix and ileocecal valve, without limitations.  The quality of the prep was good.  The instrument was then slowly withdrawn as the colon was fully examined.  <<PROCEDUREIMAGES>>  FINDINGS:  Rectal exam normal.  Prep quality  was good.  Scattered pancolonic diverticulosis.  Couple everted diverticula seen.  No polyps, masses, vascular ectasias, or inflammatory changes were seen.  Normal retroflexed view of rectum.  No old or fresh blood was seen.  ENDOSCOPIC IMPRESSION:    1.  As above. 2.  No source of iron-deficiency anemia.  Intermittent history of hematochezia could be due to mild diverticular bleed.  No old or fresh blood seen on today's exam.  RECOMMENDATIONS:      1.  Watch for potential complications of procedure. 2.  Repeat colonoscopy in 5 years. 3.  OK to discharge from GI perspective. 4.  We will offer outpatient follow-up with [place] in [facility].  REPEAT EXAM:  Yes, 5 years.  ______________________________ Willis Modena  CC:  n. eSIGNEDWillis Modena at 06/13/2011 10:52 AM  Birdena Crandall, [phone]

## 2011-06-13 NOTE — H&P (View-Only) (Signed)
PATIENT DETAILS Name: [name] Age: 56 y.o. Sex: male Date of Birth: 04/01/1955 Admit Date: 06/10/2011 PCP:DEFAULT,PROVIDER, MD, MD  Subjective: No major complaints Completely chest pain free  Objective: Vital signs in last 24 hours: Filed Vitals:   06/11/11 1223 06/11/11 1300 06/11/11 2231 06/12/11 0457  BP: 139/87 140/80 128/83 152/91  Pulse: 51 50 71 53  Temp: 98.1 F (36.7 C) 98.2 F (36.8 C) 98.2 F (36.8 C) 97.5 F (36.4 C)  TempSrc: Oral Oral Oral Oral  Resp: 14 16 18 18  Height: 5' 10" (1.778 m)     Weight: 82.2 kg (181 lb 3.5 oz)     SpO2: 100% 100% 97% 99%    Weight change: -3.53 kg (-7 lb 12.5 oz)  Body mass index is 26.00 kg/(m^2).  Intake/Output from previous day:  Intake/Output Summary (Last 24 hours) at 06/12/11 1153 Last data filed at 06/11/11 2232  Gross per 24 hour  Intake    360 ml  Output      0 ml  Net    360 ml    PHYSICAL EXAM: Gen Exam: Awake and alert with clear speech. Neck: Supple, No JVD.  Chest: B/L Clear.  CVS: S1 S2 Regular, no murmurs.  Abdomen: soft, BS +, non tender, non distended.  Extremities: no edema, lower extremities warm to touch Neurologic: Non Focal.   Skin: No Rash.   Wounds: N/A.    CONSULTS:  GI  LAB RESULTS: CBC  Lab 06/11/11 0249 06/10/11 1844 06/10/11 1433  WBC 6.1 7.1 6.5  HGB 10.6* 11.0* 10.4*  HCT 34.2* 35.3* 33.1*  PLT 366 353 368  MCV 74.8* 75.3* 74.4*  MCH 23.2* 23.5* 23.4*  MCHC 31.0 31.2 31.4  RDW 23.0* 23.6* 23.2*  LYMPHSABS -- -- --  MONOABS -- -- --  EOSABS -- -- --  BASOSABS -- -- --  BANDABS -- -- --    Chemistries   Lab 06/11/11 0249 06/10/11 1844 06/10/11 1437  NA 141 -- 137  K 4.1 -- 3.9  CL 104 -- 102  CO2 28 -- 26  GLUCOSE 86 -- 97  BUN 16 -- 15  CREATININE 1.15 1.09 0.94  CALCIUM 8.7 -- 8.4  MG -- -- --    CBG: No results found for this basename: GLUCAP:5 in the last 168 hours  GFR Estimated Creatinine Clearance: 75.8 ml/min (by C-G formula based on  Cr of 1.15).  Coagulation profile No results found for this basename: INR:5,PROTIME:5 in the last 168 hours  Cardiac Enzymes  Lab 06/11/11 0249 06/10/11 1844 06/10/11 1436  CKMB 2.0 2.1 --  TROPONINI <0.30 <0.30 <0.30  MYOGLOBIN -- -- --    No components found with this basename: POCBNP:3  Basename 06/10/11 1436  DDIMER 0.51*    Basename 06/11/11 0249  HGBA1C 5.7*    Basename 06/11/11 0249  CHOL 116  HDL 29*  LDLCALC 71  TRIG 82  CHOLHDL 4.0  LDLDIRECT --   No results found for this basename: TSH,T4TOTAL,FREET3,T3FREE,THYROIDAB in the last 72 hours  Basename 06/11/11 1003  VITAMINB12 175*  FOLATE 14.0  FERRITIN 19*  TIBC 309  IRON 55  RETICCTPCT 1.2   No results found for this basename: LIPASE:2,AMYLASE:2 in the last 72 hours  Urine Studies No results found for this basename: UACOL:2,UAPR:2,USPG:2,UPH:2,UTP:2,UGL:2,UKET:2,UBIL:2,UHGB:2,UNIT:2,UROB:2,ULEU:2,UEPI:2,UWBC:2,URBC:2,UBAC:2,CAST:2,CRYS:2,UCOM:2,BILUA:2 in the last 72 hours  MICROBIOLOGY: Recent Results (from the past 240 hour(s))  MRSA PCR SCREENING     Status: Normal   Collection Time   06/10/11  6:39 PM        Component Value Range Status Comment   MRSA by PCR NEGATIVE  NEGATIVE  Final     RADIOLOGY STUDIES/RESULTS: Dg Chest 2 View  06/10/2011  *RADIOLOGY REPORT*  Clinical Data: Mid chest pain and shortness of breath, cough, smoking history  CHEST - 2 VIEW  Comparison: None.  Findings: The lungs are clear.  There is mild cardiomegaly present. No bony abnormality is seen.  IMPRESSION: No active lung disease.  Mild cardiomegaly.  Original Report Authenticated By: PAUL D. BARRY, M.D.    MEDICATIONS: Scheduled Meds:   . bisacodyl  10 mg Oral Once  . cyanocobalamin  1,000 mcg Intramuscular Once  . enoxaparin  40 mg Subcutaneous Q24H  . hydrochlorothiazide  25 mg Oral Daily  . polyethylene glycol-electrolytes  4,000 mL Oral Once   Continuous Infusions:  PRN Meds:.acetaminophen, acetaminophen,  ondansetron (ZOFRAN) IV, ondansetron, polyethylene glycol-electrolytes, selenium sulfide, zolpidem  Antibiotics: Anti-infectives    None      Assessment/Plan: Principal Problem:  Chest pain  -Seems musculoskeletal - reproducible and constant- has ruled out for MI; EKG was absolutely normal.  - has now completely resolved  *HTN (hypertension)  -moderate control with HCTZ   Microcytic anemia with rectal bleed a few months ago  -for EGD/Colonoscopy 6/10  Bipolar disorder?  -not on any meds -mood stable   Scalp itch  Try selenium sulfide   Nicotine abuse  Counseled to stop smoking   Disposition: Remain inpatient  DVT Prophylaxis: Prophylactic Lovenox  Code Status: Full Code  Myron Lona, MD  [facility] Pager [phone]  If 7PM-7AM, please contact night-coverage [email or web address] Password [email or web address] 06/12/2011, 11:53 AM   LOS: 2 days      

## 2011-06-13 NOTE — Clinical Social Work Psychosocial (Signed)
     Clinical Social Work Department BRIEF PSYCHOSOCIAL ASSESSMENT 06/13/2011  Patient:  [name], [name]     Account Number:  [id number]     Admit date:  06/10/2011  Clinical Social Worker:  Jacelyn Grip  Date/Time:  06/13/2011 12:17 PM  Referred by:  RN  Date Referred:  06/13/2011 Referred for  Transportation assistance   Other Referral:   Interview type:  Patient Other interview type:    PSYCHOSOCIAL DATA Living Status:  FAMILY Admitted from facility:   Level of care:   Primary support name:  [name]/[phone] Primary support relationship to patient:  SIBLING Degree of support available:   unknown at this time    CURRENT CONCERNS Current Concerns  Other - See comment   Other Concerns:   Transportation assistance    SOCIAL WORK ASSESSMENT / PLAN CSW received notification form RN that pt requesting assistance with transportation for pt home. CSW and RNCM met with pt at bedside to discuss discharge plans. Pt reports that he lives in [place] and had been in [place] for legal reasons. Pt discussed that he initally had transportation back to [place] via pt niece, but pt niece fell ill and hospitalized currently. Pt reports that he does not have any other friends/family that can provide transportation. Pt reported he does not have any financial means to purchase an [employer] or [employer] at this time. CSW discussed with CSW Chiropodist and got approval for [employer]. CSW provided pt [employer] ticket, bus pass, and information on train station and train reservation schedule. RNCM assisting with PCP at [facility]. No further social work needs identified at this time. CSW signing off.   Assessment/plan status:  No Further Intervention Required Other assessment/ plan:   Information/referral to community resources:   [employer] ticket/information, bus pass    PATIENTS/FAMILYS RESPONSE TO PLAN OF CARE: Pt alert and oriented. Pt reports that he moved to  [place] approximately a month ago and lives with pt niece and has not made many other contacts in that area that could assist with transportation. Pt reports he lives blocks from train station and will be able to get to his home from train station in [place]. Pt appreciative of CSW support and assistance with transportation needs.

## 2011-06-13 NOTE — Progress Notes (Signed)
Nsg Discharge Note  Admit Date:  06/10/2011 Discharge date: 06/13/2011   Lajean Manes to be D/C'd Home per MD order.  AVS completed.  Copy for chart, and copy for patient signed, and dated. Patient/caregiver able to verbalize understanding.  Discharge Medication:  Ravin, Denardo  Home Medication Instructions ZOX:[id number]   Printed on:06/13/11 1654  Medication Information                    vitamin B-12 100 MCG tablet Take 1 tablet (100 mcg total) by mouth daily.           hydrochlorothiazide (HYDRODIURIL) 25 MG tablet Take 1 tablet (25 mg total) by mouth daily.             Discharge Assessment: Filed Vitals:   06/13/11 1309  BP: 128/78  Pulse: 63  Temp: 98.1 F (36.7 C)  Resp: 20   Skin clean, dry and intact without evidence of skin break down, no evidence of skin tears noted. IV catheter discontinued intact. Site without signs and symptoms of complications - no redness or edema noted at insertion site, patient denies c/o pain - only slight tenderness at site.  Dressing with slight pressure applied.  D/c Instructions-Education: Discharge instructions given to patient/family with verbalized understanding. D/c education completed with patient/family including follow up instructions, medication list, d/c activities limitations if indicated, with other d/c instructions as indicated by MD - patient able to verbalize understanding, all questions fully answered. Patient instructed to return to ED, call 911, or call MD for any changes in condition.  Patient escorted via WC, and D/C home via private auto.  Rayvion Stumph Consuella Lose, RN 06/13/2011 4:54 PM

## 2011-06-13 NOTE — Care Management Note (Addendum)
    Page 1 of 1   06/13/2011     4:09:01 PM   CARE MANAGEMENT NOTE 06/13/2011  Patient:  [name], [name]   Account Number:  [id number]  Date Initiated:  06/13/2011  Documentation initiated by:  Letha Cape  Subjective/Objective Assessment:   dx chest pain, htn  admit- lives with neice in [place].  PTA independent.     Action/Plan:   Anticipated DC Date:  06/13/2011   Anticipated DC Plan:  HOME/SELF CARE      DC Planning Services  CM consult      Choice offered to / List presented to:             Status of service:  Completed, signed off Medicare Important Message given?   (If response is "NO", the following Medicare IM given date fields will be blank) Date Medicare IM given:   Date Additional Medicare IM given:    Discharge Disposition:  HOME/SELF CARE  Per UR Regulation:  Reviewed for med. necessity/level of care/duration of stay  If discussed at Long Length of Stay Meetings, dates discussed:    Comments:  Dr. Lily Kocher in [place] (psych MD who patient sees)  06/13/11 16:03 Letha Cape RN, BSN [phone] patient lives with neice in [place],  patient sees Dr. Regino Schultze (psych) in [place].  Patient states that Dr. Regino Schultze will be helping him to get a PCP in [place].  Patient needs transportation back to [place], CSW referral.  Patient will have a train pass for [employer].  Patient states he can afford his medications.  Patient is eligible for med ast if needed.  NCM will continue to follow for dc needs.

## 2011-06-13 NOTE — ED Provider Notes (Signed)
Medical screening examination/treatment/procedure(s) were conducted as a shared visit with non-physician practitioner(s) and myself.  I personally evaluated the patient during the encounter  Pt seen with PA, feel admission warranted due to chest pain/concerning story Stable in the ED   Date: 06/13/2011  Rate: 66  Rhythm: normal sinus rhythm  QRS Axis: normal  Intervals: normal  ST/T Wave abnormalities: normal  Conduction Disutrbances:none     Joya Gaskins, MD 06/13/11 1204

## 2011-06-13 NOTE — Interval H&P Note (Signed)
History and Physical Interval Note:  06/13/2011 9:42 AM  [name]  has presented today for surgery, with the diagnosis of iron def anemia  The various methods of treatment have been discussed with the patient and family. After consideration of risks, benefits and other options for treatment, the patient has consented to  Procedure(s) (LRB): ESOPHAGOGASTRODUODENOSCOPY (EGD) (N/A) COLONOSCOPY (N/A) as a surgical intervention .  The patients' history has been reviewed, patient examined, no change in status, stable for surgery.  I have reviewed the patients' chart and labs.  Questions were answered to the patient's satisfaction.     [name] M  Assessment:  1.  Chest pain.  Cardiac evaluation negative. 2.  Iron-deficiency anemia. 3.  Hematochezia, intermittent. 4.  Personal history of colon polyps.  Plan:  1.  Endoscopy. 2.  Risks (bleeding, infection, bowel perforation that could require surgery, sedation-related changes in cardiopulmonary systems), benefits (identification and possible treatment of source of symptoms, exclusion of certain causes of symptoms), and alternatives (watchful waiting, radiographic imaging studies, empiric medical treatment) of upper endoscopy (EGD) were explained to patient in detail and patient wishes to proceed. 3.  Colonoscopy. 4.  Risks (bleeding, infection, bowel perforation that could require surgery, sedation-related changes in cardiopulmonary systems), benefits (identification and possible treatment of source of symptoms, exclusion of certain causes of symptoms), and alternatives (watchful waiting, radiographic imaging studies, empiric medical treatment) of colonoscopy were explained to patient in detail and patient wishes to proceed.

## 2011-06-13 NOTE — Discharge Summary (Signed)
Patient ID: [name] MRN: [id number] DOB/AGE: December 25, 1955 56 y.o.  Admit date: 06/10/2011 Discharge date: 06/13/2011  Primary Care Physician:  Dr. Regino Schultze (Psychiatrist) in [place], [place]  Discharge Diagnoses:    Present on Admission:  .Chest pain .HTN (hypertension) .Bipolar disorder   Medication List  As of 06/13/2011 12:41 PM   TAKE these medications         cyanocobalamin 100 MCG tablet   Take 1 tablet (100 mcg total) by mouth daily.      hydrochlorothiazide 25 MG tablet   Commonly known as: HYDRODIURIL   Take 1 tablet (25 mg total) by mouth daily.            Consults:  [facility], Dr. Willis Modena  Brief H and P: From the admission note:  Patient is a [name] with a past medical history of the back abuse, hypertension, bipolar disorder (not on any medications) who presents to the ED with the above-noted complaints. Apparently patient was in his usual state of health, he started experiencing chest pain after walking for round and 15 minutes. He describes the pain to be located in the center of his chest, he describes the pain as heaviness, he claims the pain at times radiates to his right arm. There is some associated nausea but no vomiting. There is no other shortness of breath. He did claim to have some mild diaphoresis. He claims the pain was 10 out of 10 at its worst, during my evaluation he claims the pain was around 5/10, although he was lying comfortably in was not in any distress at all. He denies any headache, he denies any abdominal pain.  Hospital Course:   Chest pain  Seems musculoskeletal - reproducible and constant- has ruled out for MI, cardiac enzymes x 3 are negative; EKG was normal.  Pain has now completely resolved.  Upper endoscopy did not reveal any source of chest pain.  HTN (hypertension)  Moderate control with HCTZ.  Will request the patient obtain a PCP in his home town of [place].  Microcytic anemia with rectal bleed  a few months ago  Upper endoscopy and colonoscopy completed 6/10/203.  No cause for iron deficiency anemia found.  Patient also found to be B12 deficient.  He received B12 as an injection and will d/c to home on oral B12 supplementation.  Bipolar disorder?  -not on any meds  -mood stable   Scalp itch  Try selenium sulfide OTC.  Nicotine abuse  Counseled to stop smoking   History of Drug Abuse Counseled against using recreational drugs.  Physical Exam on Discharge: General: Alert, awake, oriented x3, in no acute distress. HEENT: No bruits, no goiter. Heart: Regular rate and rhythm, without murmurs, rubs, gallops. Lungs: Clear to auscultation bilaterally. Abdomen: Soft, nontender, nondistended, positive bowel sounds. Extremities: No clubbing cyanosis or edema with positive pedal pulses. Neuro: Grossly intact, nonfocal.  Filed Vitals:   06/13/11 1100 06/13/11 1110 06/13/11 1120 06/13/11 1143  BP: 120/84 128/81 121/78 146/91  Pulse:    59  Temp:    97.6 F (36.4 C)  TempSrc:    Oral  Resp: 29 20 30 22   Height:      Weight:      SpO2: 100% 100% 100%      Intake/Output Summary (Last 24 hours) at 06/13/11 1241 Last data filed at 06/13/11 0900  Gross per 24 hour  Intake   2240 ml  Output    200 ml  Net  2040 ml    Basic Metabolic Panel:  Lab 06/11/11 4098 06/10/11 1844 06/10/11 1437  NA 141 -- 137  K 4.1 -- 3.9  CL 104 -- 102  CO2 28 -- 26  GLUCOSE 86 -- 97  BUN 16 -- 15  CREATININE 1.15 1.09 --  CALCIUM 8.7 -- 8.4  MG -- -- --  PHOS -- -- --   Liver Function Tests:  Lab 06/11/11 0249  AST 12  ALT 9  ALKPHOS 35*  BILITOT 0.1*  PROT 6.2  ALBUMIN 3.0*   CBC:  Lab 06/11/11 0249 06/10/11 1844  WBC 6.1 7.1  NEUTROABS -- --  HGB 10.6* 11.0*  HCT 34.2* 35.3*  MCV 74.8* 75.3*  PLT 366 353   Cardiac Enzymes:  Lab 06/11/11 0249 06/10/11 1844 06/10/11 1436  CKTOTAL 88 116 --  CKMB 2.0 2.1 --  CKMBINDEX -- -- --  TROPONINI <0.30 <0.30 <0.30    D-Dimer:  Lab 06/10/11 1436  DDIMER 0.51*    Hemoglobin A1C:  Lab 06/11/11 0249  HGBA1C 5.7*   Fasting Lipid Panel:  Lab 06/11/11 0249  CHOL 116  HDL 29*  LDLCALC 71  TRIG Pedro  CHOLHDL 4.0  LDLDIRECT --   Anemia Panel:  Lab 06/11/11 1003  VITAMINB12 175*  FOLATE 14.0  FERRITIN 19*  TIBC 309  IRON 55  RETICCTPCT 1.2   Urine Drug Screen: Drugs of Abuse     Component Value Date/Time   LABOPIA POSITIVE* 06/10/2011 1849   LABOPIA NONE DETECTED 10/13/2010 0406   COCAINSCRNUR NEGATIVE 06/10/2011 1849   COCAINSCRNUR NONE DETECTED 10/13/2010 0406   LABBENZ NEGATIVE 06/10/2011 1849   LABBENZ POSITIVE* 10/13/2010 0406   AMPHETMU NEGATIVE 06/10/2011 1849   AMPHETMU NONE DETECTED 10/13/2010 0406   THCU NONE DETECTED 10/13/2010 0406   LABBARB NONE DETECTED 10/13/2010 0406      Significant Diagnostic Studies:  Dg Chest 2 View  06/10/2011  *RADIOLOGY REPORT*  Clinical Data: Mid chest pain and shortness of breath, cough, smoking history  CHEST - 2 VIEW  Comparison: None.  Findings: The lungs are clear.  There is mild cardiomegaly present. No bony abnormality is seen.  IMPRESSION: No active lung disease.  Mild cardiomegaly.  Original Report Authenticated By: Juline Patch, M.D.   Disposition and Follow-up: stable for discharge to home.  Social work has arranged for transportation to [place] at the patient's request.  Discharge Orders    Future Orders Please Complete By Expires   Diet - low sodium heart healthy      Increase activity slowly        Follow-up Information    Please follow up. (Dr. Regino Schultze as previously scheduled.  Please find a primary care provider.)           Time spent on Discharge: 40 min.  SignedStephani Police 06/13/2011, 12:41 PM [phone]   Attending I have seen and examined the patient, I agree with the assessment and plan as outlined above.He is stable to discharged home  Windell Norfolk MD

## 2011-06-13 NOTE — Op Note (Signed)
[facility] [place] [address] [place], [place]  [address]  ENDOSCOPY PROCEDURE REPORT  PATIENT:  Pedro, Odonnell  MR#:  [id number] BIRTHDATE:  1955-03-31, 56 yrs. old  GENDER:  male  ENDOSCOPIST:  Willis Modena, MD Referred by:  Dr. Jerral Ralph [facility])  PROCEDURE DATE:  06/13/2011 PROCEDURE:  EGD with biopsy, 19147 ASA CLASS:  Class II INDICATIONS:  chest pain, iron-deficiency anemia  MEDICATIONS:   Fentanyl 125 mcg IV, Versed 8 mg IV, Cetacaine spray x 2  DESCRIPTION OF PROCEDURE:   After the risks benefits and alternatives of the procedure were thoroughly explained, informed consent was obtained.  The Pentax Gastroscope [id number] and [id number]) endoscope was introduced through the mouth and advanced to the second portion of the duodenum, without limitations.  The instrument was slowly withdrawn as the mucosa was fully examined.  <<PROCEDUREIMAGES>>  FINDINGS:  Normal esophagus.  Mild pre-pyloric gastric erosions and mild duodenal bulbar erythema.  Otherwise normal endoscopy to the second portion of the duodenum.  Random duodenal biopsies taken to evaluate for celiac sprue.  ENDOSCOPIC IMPRESSION:    1.  As above. 2.  No source of anemia or chest pain identified.  RECOMMENDATIONS:      1.  Watch for potential complications of procedure. 2.  Await biopsy results. 3.  Colonoscopy today.  REPEAT EXAM:  No  ______________________________ Willis Modena  CC:  n. eSIGNEDWillis Modena at 06/13/2011 10:45 AM  Birdena Crandall, [phone]

## 2011-06-14 ENCOUNTER — Encounter (HOSPITAL_COMMUNITY): Payer: Self-pay | Admitting: Gastroenterology

## 2011-08-29 ENCOUNTER — Encounter (HOSPITAL_COMMUNITY): Payer: Self-pay | Admitting: Adult Health

## 2011-08-29 ENCOUNTER — Emergency Department (HOSPITAL_COMMUNITY)
Admission: EM | Admit: 2011-08-29 | Discharge: 2011-08-30 | Disposition: A | Discharge status: Other Acute Inpatient Hospital | Payer: Self-pay | Attending: Emergency Medicine | Admitting: Emergency Medicine

## 2011-08-29 DIAGNOSIS — F329 Major depressive disorder, single episode, unspecified: Secondary | ICD-10-CM

## 2011-08-29 DIAGNOSIS — I10 Essential (primary) hypertension: Secondary | ICD-10-CM | POA: Insufficient documentation

## 2011-08-29 DIAGNOSIS — F489 Nonpsychotic mental disorder, unspecified: Secondary | ICD-10-CM | POA: Insufficient documentation

## 2011-08-29 DIAGNOSIS — F3289 Other specified depressive episodes: Secondary | ICD-10-CM | POA: Insufficient documentation

## 2011-08-29 HISTORY — DX: Malignant neoplasm of prostate: C61

## 2011-08-29 LAB — RAPID URINE DRUG SCREEN, HOSP PERFORMED
Amphetamines: NOT DETECTED
Barbiturates: NOT DETECTED
Benzodiazepines: NOT DETECTED
Cocaine: NOT DETECTED
Opiates: NOT DETECTED
Tetrahydrocannabinol: NOT DETECTED

## 2011-08-29 LAB — ETHANOL: Alcohol, Ethyl (B): <11 mg/dL (ref 0–11)

## 2011-08-29 LAB — CBC
HCT: 38.8 % — ABNORMAL LOW (ref 39.0–52.0)
Hemoglobin: 12.5 g/dL — ABNORMAL LOW (ref 13.0–17.0)
RBC: 4.83 MIL/uL (ref 4.22–5.81)
WBC: 5.9 10*3/uL (ref 4.0–10.5)

## 2011-08-29 LAB — COMPREHENSIVE METABOLIC PANEL
Alkaline Phosphatase: 44 U/L (ref 39–117)
BUN: 15 mg/dL (ref 6–23)
CO2: 29 mEq/L (ref 19–32)
Chloride: 103 mEq/L (ref 96–112)
GFR calc Af Amer: 78 mL/min — ABNORMAL LOW (ref >90)
Glucose, Bld: 91 mg/dL (ref 70–99)
Potassium: 4 mEq/L (ref 3.5–5.1)
Total Bilirubin: 0.2 mg/dL — ABNORMAL LOW (ref 0.3–1.2)

## 2011-08-29 MED ORDER — HYDROCHLOROTHIAZIDE 25 MG PO TABS
25.0000 mg | ORAL_TABLET | Freq: Every day | ORAL | Status: DC
  Administered 2011-08-30: 25 mg via ORAL
  Filled 2011-08-29: qty 1

## 2011-08-29 MED ORDER — ACETAMINOPHEN 325 MG PO TABS: 650.0000 mg | ORAL_TABLET | ORAL | Status: DC | PRN

## 2011-08-29 MED ORDER — ONDANSETRON HCL 8 MG PO TABS: 4.0000 mg | ORAL_TABLET | Freq: Three times a day (TID) | ORAL | Status: DC | PRN

## 2011-08-29 MED ORDER — ALUM & MAG HYDROXIDE-SIMETH 200-200-20 MG/5ML PO SUSP
30.0000 mL | ORAL | Status: DC | PRN
  Filled 2011-08-29: qty 30

## 2011-08-29 MED ORDER — ZOLPIDEM TARTRATE 5 MG PO TABS: 5.0000 mg | ORAL_TABLET | Freq: Every evening | ORAL | Status: DC | PRN

## 2011-08-29 MED ORDER — VITAMIN B-12 100 MCG PO TABS
100.0000 ug | ORAL_TABLET | Freq: Every day | ORAL | Status: DC
  Administered 2011-08-30: 100 ug via ORAL
  Filled 2011-08-29: qty 1

## 2011-08-29 NOTE — ED Notes (Signed)
SITTER AT BEDSIDE

## 2011-08-29 NOTE — ED Notes (Signed)
Pt placed in blue scrubs and wanded by security 

## 2011-08-29 NOTE — ED Provider Notes (Addendum)
History   Scribed for Pedro Sprout, MD, the patient was seen in room Room/bed info not found . This chart was scribed by Lewanda Rife.   CSN: [id number]  Arrival date & time 08/29/11  2157   First MD Initiated Contact with Patient 08/29/11 2220      Chief Complaint  Patient presents with  . Medical Clearance    (Consider location/radiation/quality/duration/timing/severity/associated sxs/prior treatment) HPI [name] is a 56 y.o. male who presents to the Emergency Department complaining of depression and suicidal ideations. Hx was provided by the pt. Pt states he feels like he is in "deep depression" since being diagnosed with advanced prostate cancer 3 weeks ago. Pt denies homicidal ideation. Pt denies having a plan to hurt himself, drug use, or alcohol. Pt reports hx of SI and depression. Pt states he stopped taking medications for depression last year because he was feeling better. Hx of hyperlipidemia, hypertension.    Past Medical History  Diagnosis Date  . Hypertension   . Arthritis     knees  . Prostate cancer     Past Surgical History  Procedure Date  . Colonoscopy   . Tonsillectomy   . Esophagogastroduodenoscopy 06/13/2011    Procedure: ESOPHAGOGASTRODUODENOSCOPY (EGD);  Surgeon: Willis Modena, MD;  [facility];  Service: Endoscopy;  Laterality: N/A;  . Colonoscopy 06/13/2011    Procedure: COLONOSCOPY;  Surgeon: Willis Modena, MD;  Location: [facility] ENDOSCOPY;  Service: Endoscopy;  Laterality: N/A;    Family History  Problem Relation Age of Onset  . Heart failure Mother   . Hypertension Father   . Parkinsonism Father     History  Substance Use Topics  . Smoking status: Current Everyday Smoker -- 0.5 packs/day  . Smokeless tobacco: Not on file  . Alcohol Use: Yes     casual      Review of Systems  Psychiatric/Behavioral: Positive for suicidal ideas.  All other systems reviewed and are negative.    Allergies  Aspirin and  Indocin  Home Medications   Current Outpatient Rx  Name Route Sig Dispense Refill  . HYDROCHLOROTHIAZIDE 25 MG PO TABS Oral Take 1 tablet (25 mg total) by mouth daily. 30 tablet 0  . CYANOCOBALAMIN 100 MCG PO TABS Oral Take 1 tablet (100 mcg total) by mouth daily. 30 tablet 0    BP 129/92  Pulse 52  Temp 98.7 F (37.1 C) (Oral)  Resp 16  SpO2 100%  Physical Exam  Nursing note and vitals reviewed. Constitutional: He is oriented to person, place, and time. He appears well-developed and well-nourished.  HENT:  Head: Normocephalic and atraumatic.  Eyes: Pupils are equal, round, and reactive to light.  Neck: Normal range of motion.  Cardiovascular: Normal rate.   Pulmonary/Chest: Effort normal.  Abdominal: Soft.  Musculoskeletal: Normal range of motion.  Neurological: He is alert and oriented to person, place, and time.  Psychiatric:       Blunted affect    ED Course  Procedures (including critical care time)  Labs Reviewed  CBC - Abnormal; Notable for the following:    Hemoglobin 12.5 (*)     HCT 38.8 (*)     MCH 25.9 (*)     RDW 15.9 (*)     All other components within normal limits  COMPREHENSIVE METABOLIC PANEL  ETHANOL  ACETAMINOPHEN LEVEL  URINE RAPID DRUG SCREEN (HOSP PERFORMED)   No results found.   1. Depression   2. Suicidal behavior  MDM   Patient with a history of depression who recently found out he had prostate cancer 2 weeks ago and now states that his depression has become much worse and he is contemplating suicide. He denies a plan and denies any substance use. Over a year ago he was taking medication for depression but had been off meds for a year because he was feeling better. Will have ACT evaluate. Medical clearance labs pending.    I personally performed the services described in this documentation, which was scribed in my presence.  The recorded information has been reviewed and considered.    Pedro Sprout, MD 08/29/11  1610  Pedro Sprout, MD 08/29/11 [phone]

## 2011-08-29 NOTE — ED Notes (Signed)
Reports deep depression for the last 3 weeks while in [place], recently DX with prostate cancer "its just been putting me in a deep depression and I have not been able to handle it really well. I am disconnected from my family and it is rough"  Admits to Thoughts of harming self. Denies homicidal ideation.

## 2011-08-29 NOTE — ED Notes (Signed)
PT. PERSONAL BELONGINGS INVENTORIED AND PLACED IN A LOCKER.

## 2011-08-30 ENCOUNTER — Encounter (HOSPITAL_COMMUNITY): Payer: Self-pay | Admitting: *Deleted

## 2011-08-30 ENCOUNTER — Inpatient Hospital Stay (HOSPITAL_COMMUNITY)
Admission: EM | Admit: 2011-08-30 | Discharge: 2011-09-06 | DRG: 885 | Disposition: A | Discharge status: Home / Self Care | Payer: Federal, State, Local not specified - Other | Source: Ambulatory Visit | Attending: Psychiatry | Admitting: Psychiatry

## 2011-08-30 DIAGNOSIS — E785 Hyperlipidemia, unspecified: Secondary | ICD-10-CM | POA: Diagnosis present

## 2011-08-30 DIAGNOSIS — F332 Major depressive disorder, recurrent severe without psychotic features: Principal | ICD-10-CM

## 2011-08-30 DIAGNOSIS — C61 Malignant neoplasm of prostate: Secondary | ICD-10-CM | POA: Diagnosis present

## 2011-08-30 DIAGNOSIS — M129 Arthropathy, unspecified: Secondary | ICD-10-CM | POA: Diagnosis present

## 2011-08-30 DIAGNOSIS — F339 Major depressive disorder, recurrent, unspecified: Secondary | ICD-10-CM | POA: Diagnosis present

## 2011-08-30 DIAGNOSIS — I10 Essential (primary) hypertension: Secondary | ICD-10-CM | POA: Diagnosis present

## 2011-08-30 MED ORDER — ALUM & MAG HYDROXIDE-SIMETH 200-200-20 MG/5ML PO SUSP: 30.0000 mL | ORAL | Status: DC | PRN

## 2011-08-30 MED ORDER — HYDROXYZINE HCL 50 MG PO TABS
50.0000 mg | ORAL_TABLET | Freq: Every evening | ORAL | Status: DC | PRN
  Administered 2011-08-30 – 2011-09-05 (×8): 50 mg via ORAL
  Filled 2011-08-30 (×13): qty 1
  Filled 2011-08-30 (×2): qty 28
  Filled 2011-08-30 (×4): qty 1

## 2011-08-30 MED ORDER — ACETAMINOPHEN 325 MG PO TABS
650.0000 mg | ORAL_TABLET | Freq: Four times a day (QID) | ORAL | Status: DC | PRN
  Administered 2011-09-01 – 2011-09-05 (×2): 650 mg via ORAL

## 2011-08-30 MED ORDER — VITAMIN B-12 100 MCG PO TABS
100.0000 ug | ORAL_TABLET | Freq: Every day | ORAL | Status: DC
  Administered 2011-08-30 – 2011-09-06 (×8): 100 ug via ORAL
  Filled 2011-08-30 (×10): qty 1

## 2011-08-30 MED ORDER — LISINOPRIL 20 MG PO TABS
20.0000 mg | ORAL_TABLET | Freq: Every day | ORAL | Status: DC
  Administered 2011-08-30 – 2011-09-06 (×8): 20 mg via ORAL
  Filled 2011-08-30 (×5): qty 1
  Filled 2011-08-30: qty 14
  Filled 2011-08-30 (×5): qty 1

## 2011-08-30 MED ORDER — HYDROCHLOROTHIAZIDE 25 MG PO TABS
25.0000 mg | ORAL_TABLET | Freq: Every day | ORAL | Status: DC
  Administered 2011-08-31 – 2011-09-06 (×7): 25 mg via ORAL
  Filled 2011-08-30 (×3): qty 1
  Filled 2011-08-30: qty 14
  Filled 2011-08-30 (×6): qty 1

## 2011-08-30 MED ORDER — MAGNESIUM HYDROXIDE 400 MG/5ML PO SUSP: 30.0000 mL | Freq: Every day | ORAL | Status: DC | PRN

## 2011-08-30 MED ORDER — SIMVASTATIN 20 MG PO TABS
20.0000 mg | ORAL_TABLET | Freq: Every day | ORAL | Status: DC
  Administered 2011-08-30 – 2011-09-05 (×7): 20 mg via ORAL
  Filled 2011-08-30 (×10): qty 1

## 2011-08-30 MED ORDER — SERTRALINE HCL 50 MG PO TABS
50.0000 mg | ORAL_TABLET | Freq: Every day | ORAL | Status: DC
  Administered 2011-08-31: 50 mg via ORAL
  Filled 2011-08-30 (×4): qty 1
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
## 2011-08-30 NOTE — Progress Notes (Signed)
Patient ID: [name], male   DOB: [birth year], 56 y.o.   MRN: [id number] D:  56 year old African American male admitted from [facility] ED.  Patient was diagnosed with prostate cancer about three weeks ago.  States he was on his way back home from [place] and was having thoughts of suicide, knowing he has guns in the home he went straight to the ED rather than go home.  Patient is married, but does not want to communicate with her at this time.  Has four children but all have left home at this point.  He is on medication for high blood pressure and for high cholesterol.   A:  Admission assessment done.  Patient oriented to the unit, the unit schedule, and to his surroundings.  He was offered food and fluids. R:  Patient was pleasant and cooperative with the admission process.  Skin assessment is negative.

## 2011-08-30 NOTE — Tx Team (Addendum)
Initial Interdisciplinary Treatment Plan  PATIENT STRENGTHS: (choose at least two) Ability for insight Active sense of humor Average or above average intelligence Capable of independent living Communication skills Financial means General fund of knowledge Motivation for treatment/growth  PATIENT STRESSORS: Health problems   PROBLEM LIST: Problem List/Patient Goals Date to be addressed Date deferred Reason deferred Estimated date of resolution  Major depression with suicidal ideation 30 Aug 2011                                                      DISCHARGE CRITERIA:  Ability to meet basic life and health needs Adequate post-discharge living arrangements Improved stabilization in mood, thinking, and/or behavior Need for constant or close observation no longer present Reduction of life-threatening or endangering symptoms to within safe limits Verbal commitment to aftercare and medication compliance  PRELIMINARY DISCHARGE PLAN: Outpatient therapy Return to previous living arrangement Return to previous work or school arrangements Referrals indicated:    PATIENT/FAMIILY INVOLVEMENT: This treatment plan has been presented to and reviewed with the patient, [name], and/or family member.  The patient and family have been given the opportunity to ask questions and make suggestions.  Izola Price Mae 08/30/2011, 10:13 AM

## 2011-08-30 NOTE — Progress Notes (Signed)
[facility] Group Notes:  (Counselor/Nursing/MHT/Case Management/Adjunct)  08/30/2011 7:23 PM  Type of Therapy:  Recovery Goals: The focus of this group is to identify appropriate goals for recovery and establish a plan to achieve them.  Participation Level:  Active  Participation Quality:  Appropriate  Affect:  Appropriate  Cognitive:  Appropriate  Insight:  Good  Engagement in Group:  Good  Engagement in Therapy:  Good  Modes of Intervention:  Education  Summary of Progress/Problems: Pt participated in recovery goals group. Pt defined what recovery is. Pt also was given a personal recovery goals worksheet. Pt worked on the worksheet and wrote what changes needed to be made and set a goal toward the change. Pt was asked to make specific, measurable goals that would be realistic to accomplish. Pt shared when asked questions during group. Pt was very cooperative in group and expressed his willingness to make some changes in his life.    Ardelle Park O 08/30/2011, 7:23 PM

## 2011-08-30 NOTE — Progress Notes (Signed)
Psychoeducational Group Note  Date:  08/30/2011 Time:  2000  Group Topic/Focus:  Wrap-Up Group:   The focus of this group is to help patients review their daily goal of treatment and discuss progress on daily workbooks.  Participation Level:  Did Not Attend  Participation Quality:  N/A Did not attend  Affect:    Cognitive:    Insight:    Engagement in Group:    Additional Comments:  Patient did not attend wrap-up group this evening. Patient remained in room and in bathroom shower for the duration of wrap-up group this evening.  Rhiley Tarver, Newton Pigg 08/30/2011, 8:49 PM

## 2011-08-30 NOTE — ED Provider Notes (Signed)
  Physical Exam  BP 116/65  Pulse 61  Temp 98.3 F (36.8 C) (Oral)  Resp 16  SpO2 100%  Physical Exam  ED Course  Procedures  MDM Change of shift - pt has been accepted by [facility] by Dr. Dan Humphreys, he is voluntary at this time.      Vida Roller, MD 08/30/11 [phone]
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
## 2011-08-30 NOTE — Progress Notes (Signed)
[facility] Group Notes:  (Counselor/Nursing/MHT/Case Management/Adjunct) 08/30/2011  1:15pm-2:30pm Finding Balance in Life   Type of Therapy:  Group Therapy  Participation Level:  Did Not Attend     [name], LCSW 08/30/2011  4:00 PM
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
## 2011-08-30 NOTE — BH Assessment (Signed)
[facility] Assessment Progress Note      Support paperwork completed and faxed to [facility].  ED staff to arrange transport via security to [facility], as pt is voluntary.

## 2011-08-31 DIAGNOSIS — F4323 Adjustment disorder with mixed anxiety and depressed mood: Secondary | ICD-10-CM

## 2011-08-31 DIAGNOSIS — F339 Major depressive disorder, recurrent, unspecified: Secondary | ICD-10-CM

## 2011-08-31 MED ORDER — BACITRACIN-NEOMYCIN-POLYMYXIN 400-5-5000 EX OINT
TOPICAL_OINTMENT | Freq: Three times a day (TID) | CUTANEOUS | Status: DC
Start: 2011-08-31 — End: 2011-09-06
  Administered 2011-08-31 – 2011-09-05 (×6): 1 via TOPICAL

## 2011-08-31 MED ORDER — CITALOPRAM HYDROBROMIDE 10 MG PO TABS
10.0000 mg | ORAL_TABLET | Freq: Every day | ORAL | Status: DC
  Administered 2011-09-01 – 2011-09-02 (×2): 10 mg via ORAL
  Filled 2011-08-31 (×4): qty 1

## 2011-08-31 NOTE — Progress Notes (Signed)
[facility] Group Notes: (Counselor/Nursing/MHT/Case Management/Adjunct) 08/31/2011   1:15-2:30pm Emotion Regulation  Type of Therapy:  Group Therapy  Participation Level:  None  Participation Quality: Attentive    Affect:  Blunted  Cognitive:  Appropriate  Insight:  None  Engagement in Group: None  Engagement in Therapy: None   Modes of Intervention:  Support and Exploration  Summary of Progress/Problems: [name] was attentive, but did not engage. He sat in the back of the group room and watched.  [name], LCSW 08/31/2011  2:52 PM

## 2011-08-31 NOTE — Progress Notes (Signed)
Patient ID: [name], male   DOB: 1955/07/12, 56 y.o.   MRN: [id number] D: Pt. In bed reading, but responds to Clinical research associate and reports depression at "8" of 10. Pt. Reports in group he will take everything positive in. He notes his goal " to recover from depression go home make the very best of what life I have left" "I plan to survive and prosper, take meds and find a support system on the outside. A: Staff will monitor q11min for safety. Staff encourages group attendance. Writer commended pt. On the decision to seek a support group to attend after discharge and noted that many people relapse with the lack of support. R: Pt. Did not attend group preferred to read in his room this evening. Pt. Remains safe on the unit.

## 2011-08-31 NOTE — Progress Notes (Signed)
Psychoeducational Group Note  Date:  08/31/2011 Time:  2000  Group Topic/Focus:  Wrap-Up Group:   The focus of this group is to help patients review their daily goal of treatment and discuss progress on daily workbooks.  Participation Level:  Minimal  Participation Quality:  minimal  Affect:  Flat  Cognitive:  Appropriate  Insight:  Limited  Engagement in Group:  Limited  Additional Comments:  Pt did not interact most of group. He did state that he had an ok day.  Kaleen Odea R 08/31/2011, 10:05 PM

## 2011-08-31 NOTE — Progress Notes (Signed)
Psychoeducational Group Note  Date:  08/31/2011 Time:  1100   Group Topic/Focus:  Goals Group:   The focus of this group is to help patients establish daily goals to achieve during treatment and discuss how the patient can incorporate goal setting into their daily lives to aide in recovery.  Participation Level:  Did Not Attend  Participation Quality:    Affect:    Cognitive:    Insight:    Engagement in Group:    Additional Comments:  Pt did not attend/pt refuse to join the group  Lenox Ahr 08/31/2011, 1:41 PM

## 2011-08-31 NOTE — Progress Notes (Signed)
Patient resting quietly with eyes closed. respirations even and unlabored. No distress noted. Q 15 minute check continues as ordered for safety.

## 2011-08-31 NOTE — Progress Notes (Signed)
08/31/2011         Time: 1500      Group Topic/Focus: The focus of this group is on enhancing the patient's understanding of leisure, barriers to leisure, and the importance of engaging in positive leisure activities upon discharge for improved total health.  Participation Level: Did not attend  Participation Quality: Not Applicable  Affect: Not Applicable  Cognitive: Not Applicable   Additional Comments: Patient refused group.   Arien Morine 08/31/2011 3:44 PM  

## 2011-08-31 NOTE — Progress Notes (Signed)
Patient seen during discharge planning group and after.  He refused to provide information during group but remain in the room during the entire session. Patient endorses SI but able to contract for safety.  He rates all symptoms at ten.  He advised of plan to use his gun to kill himself after being diagnosed with prostate cancer.   Patient considering whether he will allow a family member to secure his weapon.  He reports having home and transportation.  He will need referral for follow up and assistance with indigent medications.

## 2011-08-31 NOTE — Tx Team (Addendum)
Interdisciplinary Treatment Plan Update (Adult)  Date:  08/31/2011  Time Reviewed:  10:35 AM   Progress in Treatment: Attending groups: Yes Participating in groups:  Yes Taking medication as prescribed:  Yes Tolerating medication: Yes Family/Significant othe contact made:  Will not allow contact at this time Patient understands diagnosis: Yes Discussing patient identified problems/goals with staff:  Yes Medical problems stabilized or resolved: Yes Denies suicidal/homicidal ideation: No, reports he has not yet decided whether he wants to live or die Issues/concerns per patient self-inventory:  No  Other:  New problem(s) identified: None  Reason for Continuation of Hospitalization: Depression Anxiety Medication Stabilization Suicidal Ideation  Interventions implemented related to continuation of hospitalization:  Medication stabilization, safety checks q 15 mins, group attendance  Additional comments:  Estimated length of stay: 3-5 days  Discharge Plan: Rogers will discharge to his own home and follow up with outpatient treatment in [facility] goal(s):  Review of initial/current patient goals per problem list:   1.  Goal(s):  Decrease depressive symptoms to a rating of 4 or less  Met:  No  Target date:  By discharge  As evidenced by: Laquentin rates depression at 10  2.  Goal (s): Decrease anxiety symptoms to a rating of 4 or less  Met:  No  Target date: by discharge  As evidenced by: Jabree rates anxiety at 10  3.  Goal(s): Reduce potential for suicide/self-harm  Met:  No  Target date: by discharge  As evidenced by: Treshaun reports he is still suicidal today  4.  Goal(s): Medication Stabilization  Met:  No  Target date: by discharge  As evidenced by: Avyay has not yet been evaluated for medication adjustments  Attendees: Patient:     Family:     Physician:  Dr Orson Aloe, MD 08/31/2011 10:35 AM  Nursing:   Berneice Heinrich, RN 08/31/2011 10:35 AM    Case Manager:  Juline Patch, LCSW 08/31/2011 10:35 AM  Counselor:  [name], LCSW 08/31/2011 10:35 AM  Other:  Reyes Ivan, LCSWA 08/31/2011 10:35 AM  Other:  Nestor Ramp, RN 08/31/2011 10:35 AM  Other:  Gillis Ends, MSW Intern 08/31/2011 10:35 AM  Other:  Druscilla Brownie, Student Nurse 08/31/2011 10:35AM   Scribe for Treatment Team:   Billie Lade, 08/31/2011 10:35 AM

## 2011-08-31 NOTE — Progress Notes (Signed)
  D) Patient appears depressed and sad upon my assessment. Patient states slept "poor," and  appetite is " poor." Patient endorses passive SI, verbally contracts for safety with RN.  Patient denies HI, denies A/V hallucinations.   A) Patient offered support and encouragement, patient encouraged to discuss feelings/concerns with staff. Patient verbalized understanding. Patient monitored Q15 minutes for safety. Patient met with MD to discuss today's goals and plan of care.  R) Patient attending some groups in day room and all meals in dining room.  Patient taking medications as ordered. Will continue to monitor.

## 2011-08-31 NOTE — Progress Notes (Signed)
[facility] Group Notes:  (Counselor/Nursing/MHT/Case Management/Adjunct)  08/31/2011 12:01 PM  Type of Therapy:  Psychoeducational Skills  Participation Level:  None  Summary of Progress/Problems: [name] attended Psychoeducational group that focused on using quality time with support systems/individuals to engage in healthy coping skills. [name] refused to participate in activity and was asked to leave group.  Wandra Scot 08/31/2011, 12:01 PM

## 2011-08-31 NOTE — Progress Notes (Signed)
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 Hypertension   . Arthritis     knees  . Prostate cancer    Axis IV: other psychosocial or environmental problems Axis V: 31-40 impairment in reality testing  ADL's:  Intact  Sleep: Poor  Appetite:  Poor  Suicidal Ideation:  Pt came to the hospital because he was thinking of putting a bullet  through his head, like his father used to always say if the father ever would get a serious diagnosis. Homicidal Ideation:  Pt denies any thoughts, plans, intent of homicide  AEB (as evidenced by): per pt report  Mental Status Examination/Evaluation: Objective:  Appearance: Casual  Eye Contact::  Good  Speech:  Clear and Coherent  Volume:  loud deep voice in his natural speaking voice  Mood:  Anxious, Depressed, Hopeless, Irritable and Worthless  Affect:  Congruent  Thought Process:  Coherent  Orientation:  Full  Thought Content:  WDL  Suicidal Thoughts:  Yes.  without intent/plan  Homicidal Thoughts:  No  Memory:  Immediate;   Fair Recent;   Good Remote;   Fair  Judgement:  Impaired  Insight:  Lacking  Psychomotor Activity:  Normal  Concentration:  Fair  Recall:  Fair  Akathisia:  No  Handed:  Right  AIMS (if indicated):     Assets:  Communication Skills Desire for Improvement  Sleep:  Number of Hours: 6.75    Vital Signs:Blood pressure 155/97, pulse 66, temperature 98 F (36.7 C), temperature source Oral, resp. rate 16, height 5' 10.5" (1.791 m), weight 83.462 kg (184 lb). Current Medications: Current Facility-Administered Medications  Medication Dose Route Frequency Provider Last Rate Last Dose  . acetaminophen (TYLENOL) tablet 650 mg  650 mg Oral Q6H PRN Curlene Labrum Readling, MD      . alum & mag hydroxide-simeth (MAALOX/MYLANTA) 200-200-20 MG/5ML  suspension 30 mL  30 mL Oral Q4H PRN Curlene Labrum Readling, MD      . citalopram (CELEXA) tablet 10 mg  10 mg Oral Daily Mike Craze, MD      . hydrochlorothiazide (HYDRODIURIL) tablet 25 mg  25 mg Oral Daily Curlene Labrum Readling, MD   25 mg at 08/31/11 0805  . hydrOXYzine (ATARAX/VISTARIL) tablet 50 mg  50 mg Oral QHS,MR X 1 Mickie D. Adams, PA   50 mg at 08/30/11 2237  . lisinopril (PRINIVIL,ZESTRIL) tablet 20 mg  20 mg Oral Daily Curlene Labrum Readling, MD   20 mg at 08/31/11 0806  . magnesium hydroxide (MILK OF MAGNESIA) suspension 30 mL  30 mL Oral Daily PRN Curlene Labrum Readling, MD      . neomycin-bacitracin-polymyxin (NEOSPORIN) ointment   Topical TID Mike Craze, MD      . simvastatin (ZOCOR) tablet 20 mg  20 mg Oral QHS Curlene Labrum Readling, MD   20 mg at 08/30/11 2119  . vitamin B-12 (CYANOCOBALAMIN) tablet 100 mcg  100 mcg Oral Daily Curlene Labrum Readling, MD   100 mcg at 08/31/11 0805  . DISCONTD: sertraline (ZOLOFT) tablet 50 mg  50 mg Oral Daily Curlene Labrum Readling, MD   50 mg at 08/31/11 1610    Lab Results:  Results for orders placed during the hospital encounter of 08/29/11 (from the past 48 hour(s))  CBC     Status: Abnormal  Collection Time   08/29/11 10:06 PM      Component Value Range Comment   WBC 5.9  4.0 - 10.5 K/uL    RBC 4.83  4.22 - 5.81 MIL/uL    Hemoglobin 12.5 (*) 13.0 - 17.0 g/dL    HCT 16.1 (*) 09.6 - 52.0 %    MCV 80.3  78.0 - 100.0 fL    MCH 25.9 (*) 26.0 - 34.0 pg    MCHC 32.2  30.0 - 36.0 g/dL    RDW 04.5 (*) 40.9 - 15.5 %    Platelets 373  150 - 400 K/uL   COMPREHENSIVE METABOLIC PANEL     Status: Abnormal   Collection Time   08/29/11 10:06 PM      Component Value Range Comment   Sodium 140  135 - 145 mEq/L    Potassium 4.0  3.5 - 5.1 mEq/L    Chloride 103  96 - 112 mEq/L    CO2 29  19 - 32 mEq/L    Glucose, Bld 91  70 - 99 mg/dL    BUN 15  6 - 23 mg/dL    Creatinine, Ser 8.11  0.50 - 1.35 mg/dL    Calcium 9.4  8.4 - 91.4 mg/dL    Total Protein 7.3  6.0 - 8.3 g/dL      Albumin 3.8  3.5 - 5.2 g/dL    AST 26  0 - 37 U/L    ALT 30  0 - 53 U/L    Alkaline Phosphatase 44  39 - 117 U/L    Total Bilirubin 0.2 (*) 0.3 - 1.2 mg/dL    GFR calc non Af Amer 68 (*) >90 mL/min    GFR calc Af Amer 78 (*) >90 mL/min   ETHANOL     Status: Normal   Collection Time   08/29/11 10:06 PM      Component Value Range Comment   Alcohol, Ethyl (B) <11  0 - 11 mg/dL   ACETAMINOPHEN LEVEL     Status: Normal   Collection Time   08/29/11 10:06 PM      Component Value Range Comment   Acetaminophen (Tylenol), Serum <15.0  10 - 30 ug/mL   URINE RAPID DRUG SCREEN (HOSP PERFORMED)     Status: Normal   Collection Time   08/29/11 10:40 PM      Component Value Range Comment   Opiates NONE DETECTED  NONE DETECTED    Cocaine NONE DETECTED  NONE DETECTED    Benzodiazepines NONE DETECTED  NONE DETECTED    Amphetamines NONE DETECTED  NONE DETECTED    Tetrahydrocannabinol NONE DETECTED  NONE DETECTED    Barbiturates NONE DETECTED  NONE DETECTED     Physical Findings: AIMS: Facial and Oral Movements Muscles of Facial Expression: None, normal Lips and Perioral Area: None, normal Jaw: None, normal Tongue: None, normal,Extremity Movements Upper (arms, wrists, hands, fingers): None, normal Lower (legs, knees, ankles, toes): None, normal, Trunk Movements Neck, shoulders, hips: None, normal, Overall Severity Severity of abnormal movements (highest score from questions above): None, normal Incapacitation due to abnormal movements: None, normal Patient's awareness of abnormal movements (rate only patient's report): No Awareness, Dental Status Current problems with teeth and/or dentures?: No Does patient usually wear dentures?: No  CIWA:    COWS:     Treatment Plan Summary: Daily contact with patient to assess and evaluate symptoms and progress in treatment Medication management Mood/anxiety less than 3/10 where the scale is 1 is  the best and 10 is the worst No suicidal or homicidal  thoughts for at least 48 hours.  Plan: Admit, stop Zoloft as he reports sedation from it.  Shift to Celexa which has a reported quicker response time.  Triple antibiotic ointment to (R) neck.  Use Vistaril for sedation.  [name] 08/31/2011, 5:57 PM

## 2011-09-01 MED ORDER — AMOXICILLIN 500 MG PO CAPS
500.0000 mg | ORAL_CAPSULE | Freq: Three times a day (TID) | ORAL | Status: DC
  Administered 2011-09-01 – 2011-09-06 (×14): 500 mg via ORAL
  Filled 2011-09-01 (×7): qty 1
  Filled 2011-09-01: qty 15
  Filled 2011-09-01 (×13): qty 1
  Filled 2011-09-01: qty 15
  Filled 2011-09-01 (×2): qty 1
  Filled 2011-09-01: qty 15

## 2011-09-01 NOTE — Progress Notes (Signed)
[facility] Group Notes:  (Counselor/Nursing/MHT/Case Management/Adjunct) 09/01/2011  1:15pm-2:30pm [facility]   Type of Therapy:  Group Therapy  Participation Level:  Did Not Attend     [name], LCSW 09/01/2011  4:06 PM

## 2011-09-01 NOTE — Progress Notes (Signed)
Patient seen during d/c planning group.  He continues to endorses SI and rates all symptoms at ten.  Patient continues to be state that he does not want to share during group.  Writer met with patient who was encouraged to talk about concerns during group.  Patient was encouraged to monitored any personal information but advised of how importance of participating in group.

## 2011-09-01 NOTE — Progress Notes (Signed)
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 Hypertension   . Arthritis     knees  . Prostate cancer    Axis IV: other psychosocial or environmental problems Axis V: 41-50 serious symptoms  ADL's:  Intact  Sleep: Fair  Appetite:  Fair  Suicidal Ideation:  Pt notes off and on suicidal thoughts, but contracts for safety. Homicidal Ideation:  Pt denies any thoughts, plans, intent of homicide  AEB (as evidenced by): per pt report  Mental Status Examination/Evaluation: Objective:  Appearance: Casual  Eye Contact::  Good  Speech:  Clear and Coherent  Volume:  loud deep voice in his natural speaking voice  Mood:  Anxious, Depressed, Hopeless and Irritable  Affect:  Congruent  Thought Process:  Coherent  Orientation:  Full  Thought Content:  WDL  Suicidal Thoughts:  No  Homicidal Thoughts:  No  Memory:  Immediate;   Fair Recent;   Good Remote;   Fair  Judgement:  Impaired  Insight:  Lacking  Psychomotor Activity:  Normal  Concentration:  Fair  Recall:  Fair  Akathisia:  No  Handed:  Right  AIMS (if indicated):     Assets:  Communication Skills Desire for Improvement  Sleep:  Number of Hours: 6.75    ROS: Neuro: no headaches, ataxia, weakness  GI: no N/V/D/cramps/constipation  MS: no weakness, muscle cramps, aches  Integument: notes a persistent infection on the (R) neck that the triple antibiotic didn't help.  He asks for Amoxil that had helped in the past.  Vital Signs:Blood pressure 157/99, pulse 54, temperature 98.2 F (36.8 C), temperature source Oral, resp. rate 16, height 5' 10.5" (1.791 m), weight 83.462 kg (184 lb). Current Medications: Current Facility-Administered Medications  Medication Dose Route Frequency Provider Last Rate Last Dose  . acetaminophen (TYLENOL) tablet  650 mg  650 mg Oral Q6H PRN Curlene Labrum Readling, MD   650 mg at 09/01/11 1654  . alum & mag hydroxide-simeth (MAALOX/MYLANTA) 200-200-20 MG/5ML suspension 30 mL  30 mL Oral Q4H PRN Curlene Labrum Readling, MD      . amoxicillin (AMOXIL) capsule 500 mg  500 mg Oral Q8H Mike Craze, MD   500 mg at 09/01/11 1817  . citalopram (CELEXA) tablet 10 mg  10 mg Oral Daily Mike Craze, MD   10 mg at 09/01/11 0818  . hydrochlorothiazide (HYDRODIURIL) tablet 25 mg  25 mg Oral Daily Curlene Labrum Readling, MD   25 mg at 09/01/11 0645  . hydrOXYzine (ATARAX/VISTARIL) tablet 50 mg  50 mg Oral QHS,MR X 1 Mickie D. Adams, PA   50 mg at 09/01/11 2132  . lisinopril (PRINIVIL,ZESTRIL) tablet 20 mg  20 mg Oral Daily Curlene Labrum Readling, MD   20 mg at 09/01/11 0644  . magnesium hydroxide (MILK OF MAGNESIA) suspension 30 mL  30 mL Oral Daily PRN Curlene Labrum Readling, MD      . neomycin-bacitracin-polymyxin (NEOSPORIN) ointment   Topical TID Mike Craze, MD   1 application at 09/01/11 0818  . simvastatin (ZOCOR) tablet 20 mg  20 mg Oral QHS Curlene Labrum Readling, MD   20 mg at 09/01/11 2132  . vitamin B-12 (CYANOCOBALAMIN) tablet 100 mcg  100 mcg Oral Daily Ronny Bacon, MD   100 mcg at 09/01/11 0818    Lab Results:  No results found for this or any previous visit (from the past 48 hour(s)).  Physical Findings: AIMS: Facial and Oral Movements Muscles of Facial Expression: None, normal Lips and Perioral Area: None, normal Jaw: None, normal Tongue: None, normal,Extremity Movements Upper (arms, wrists, hands, fingers): None, normal Lower (legs, knees, ankles, toes): None, normal, Trunk Movements Neck, shoulders, hips: None, normal, Overall Severity Severity of abnormal movements (highest score from questions above): None, normal Incapacitation due to abnormal movements: None, normal Patient's awareness of abnormal movements (rate only patient's report): No Awareness, Dental Status Current problems with teeth and/or dentures?:  No Does patient usually wear dentures?: No  CIWA:  CIWA-Ar Total: 1  COWS:  COWS Total Score: 0   Treatment Plan Summary: Daily contact with patient to assess and evaluate symptoms and progress in treatment Medication management Mood/anxiety less than 3/10 where the scale is 1 is the best and 10 is the worst No suicidal or homicidal thoughts for at least 48 hours.  Plan: Pt challenged to consider listening to advice from others.  He was given feedback that he is an in charge kind of guy, but this time it might be time to listen to someone else.  He was challenged to consider actually letting his family in on his secret and let them love him and care for him like they will want to do.  He seemed to accept that advice.  In treatment team he confessed that RECOVERY was his goal for this hospital stay.   He asks for Amoxil for the infected skin area under his chin.  Will order and see if that helps.  Georgenia Salim 09/01/2011, 10:08 PM

## 2011-09-01 NOTE — Tx Team (Addendum)
Interdisciplinary Treatment Plan Update (Adult)  Date:  09/01/2011  Time Reviewed:  10:23 AM   Progress in Treatment: Attending groups:   Yes   Participating in groups:  Yes Taking medication as prescribed:  Yes Tolerating medication:  Yes Family/Significant othe contact made: Patient declining to all to contact with family at this time Patient understands diagnosis:  Yes Discussing patient identified problems/goals with staff: Yes Medical problems stabilized or resolved: Yes Denies suicidal/homicidal ideation:Yes Issues/concerns per patient self-inventory:  Other:  New problem(s) identified:  Reason for Continuation of Hospitalization: Anxiety Depression Medication stabilization Suicidal ideation  Interventions implemented related to continuation of hospitalization: medication management, groups for coping skills development, and monitoring for safety.  Medication Management; safety checks q 15 mins  Additional comments:  Estimated length of stay:  Discharge Plan:  New goal(s):  Review of initial/current patient goals per problem list:    1.  Goal(s): Eliminate SI/other thoughts of self harm   Met:  No  Target date: d/c  As evidenced by: Patient will no longer endorse SI/other thought self harm   2.  Goal (s): Reduce depression/anxiety   Met:  No  Target date: d/c  As evidenced by: Patient will rate symptoms at four or below  3.  Goal(s): .stabilize on meds   Met:  No  Target date: d/c  As evidenced by: Patient reports being stabilized on medications - less symptomatic    4.  Goal(s): Refer for outpatient follow up   Met:  No  Target date: d/c  As evidenced by: Follow up appointment scheduled    Attendees: Patient:  [name] 09/01/2011 11:33 AM  Nursing:  Quintella Reichert, RN 09/01/2011 11:33 AM  Physician:  Orson Aloe, MD 09/01/2011 10:23 AM   Nursing:   Lawson Fiscal, RN 09/01/2011 10:23 AM   CaseManager:  Juline Patch, LCSW  09/01/2011 10:23 AM   Counselor:  [name], LCSW 09/01/2011 10:23 AM   Other:  Reyes Ivan, LCSWA     09/01/2011 11:34 AM Other:   Foye Clock, MSW Intern,     09/01/2011 11:34 AM

## 2011-09-01 NOTE — Progress Notes (Signed)
Patient ID: DARRAGH NAY, male   DOB: 1955-07-12, 56 y.o.   MRN: [id number] D: Pt. In day room watching sports. Pt. Not interacting on the milieu. Pt. Reports depression at "8" of 10. Pt. Denies SHI. Pt. Makes brief eye contact answers most question with "uhm uhm". Pt. Does admit no visitors and that no one knows he is at [facility]. Pt. Encouraged to let family know that he is at a safe place so they won't worry. Pt. Smile and says "eventually". A: Writer encourage pt. To attend karaoke. Pt. Encouraged to verbalize feelings. Staff will monitor q27min for safety. R: Pt. Guarded, doesn't reveal much. Pt. Secretive about whereabouts unknown to family. Pt. Remains safe on the unit.

## 2011-09-01 NOTE — Progress Notes (Addendum)
Patient self inventory form, sleeps well, poor appetite, low energy level, poor attention span.   Rated depression and hopelessness #10.  Denied withdrawals.   SI, but contracts for safety.  Feels sleepy.   Denied pain.  After discharge, plans to follow up with treatment program and take his meds appropriately.  No questions for staff.   Plans to go home after discharge.   No problems taking meds after discharge. Did not attend afternoon group. Encouraged patient to participate in groups.  Does not want to talk to his family about his problems.   Staff aware of his participation.

## 2011-09-02 MED ORDER — CITALOPRAM HYDROBROMIDE 20 MG PO TABS
20.0000 mg | ORAL_TABLET | Freq: Every day | ORAL | Status: DC
  Administered 2011-09-03 – 2011-09-06 (×4): 20 mg via ORAL
  Filled 2011-09-02: qty 1
  Filled 2011-09-02: qty 14
  Filled 2011-09-02 (×5): qty 1

## 2011-09-02 MED ORDER — LOPERAMIDE HCL 2 MG PO CAPS: 2.0000 mg | ORAL_CAPSULE | ORAL | Status: DC | PRN

## 2011-09-02 NOTE — Progress Notes (Signed)
Patient attended group.  His mood was somber.  Patient expressed that he was doing very poorly today.  When asked what goals we could work on to assist with his recovery he was unable to identify anything.  He communicates that his ratings of depression, anxiety, hopelessness, and helplessness were all at an 8 today.  Patient is having thoughts of SI.  Patient contracts for safety.

## 2011-09-02 NOTE — Progress Notes (Signed)
  D) Patient pleasant and cooperative upon my assessment. Patient appears brighter today , discussing family and children with RN and peers. Patient more engaged with staff and peers this shift. Patient states slept "poor," and  appetite is "improving." Patient rates depression as   8/10, patient rates hopeless feelings as 8/10. Patient endorses passive SI, verbally contracts for safety with Clinical research associate. Patient denies HI, denies A/V hallucinations.   A) Patient offered support and encouragement, patient encouraged to discuss feelings/concerns with staff. Patient verbalized understanding. Patient monitored Q15 minutes for safety. Patient met with MD to discuss today's goals and plan of care.  R) Patient active on unit, attending groups in day room and meals in dining room.  Patient taking medications as ordered. Will continue to monitor.

## 2011-09-02 NOTE — Progress Notes (Signed)
Psychoeducational Group Note  Date:  09/02/2011 Time:  2000  Group Topic/Focus:  Karaoke  Participation Level:  Minimal  Participation Quality:  Appropriate  Affect:  Appropriate  Cognitive:  Appropriate  Insight:  Limited  Engagement in Group:  Limited  Additional Comments:  Pt attended Karaoke but did not sing.   Nichola Sizer 09/02/2011, 1:24 AM

## 2011-09-02 NOTE — Progress Notes (Signed)
[facility] Group Notes:  (Counselor/Nursing/MHT/Case Management/Adjunct) 09/02/2011  1:15pm-2:30pm Preventing Relapse   Type of Therapy:  Group Therapy  Participation Level:  Did Not Attend     [name], LCSW 09/02/2011  2:43 PM

## 2011-09-02 NOTE — Progress Notes (Signed)
09/02/2011      Time: 1500      Group Topic/Focus: The focus of this group is on discussing various styles of communication and communicating assertively using 'I' (feeling) statements.  Participation Level: Did not attend  Participation Quality: Not Applicable  Affect: Not Applicable  Cognitive: Not Applicable   Additional Comments: Patient in bed, refused group.   Mikhail Hallenbeck 09/02/2011 3:33 PM

## 2011-09-02 NOTE — Progress Notes (Signed)
[facility] Group Notes:  (Counselor/Nursing/MHT/Case Management/Adjunct)  09/02/2011 3:01 PM  Type of Therapy:  Psychoeducational Skills  Participation Level:  Did Not Attend   Summary of Progress/Problems:Patient did not attend group.   Ardelle Park O 09/02/2011, 3:01 PM
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
## 2011-09-02 NOTE — Progress Notes (Signed)
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 Hypertension   . Arthritis     knees  . Prostate cancer    Axis IV: other psychosocial or environmental problems Axis V: 41-50 serious symptoms  ADL's:  Intact  Sleep: Poor, at night. Has a noisy roommate.  Suggested getting ear plugs. On the other hand suggested that he take the virtual ear plugs and listen to what is being suggested by the staff here.  Appetite:  Fair  Suicidal Ideation:  Pt notes off and on suicidal thoughts, but contracts for safety. Homicidal Ideation:  Pt denies any thoughts, plans, intent of homicide  AEB (as evidenced by): per pt report  Mental Status Examination/Evaluation: Objective:  Appearance: Casual  Eye Contact::  Good  Speech:  Clear and Coherent  Volume:  loud deep voice in his natural speaking voice  Mood:  Anxious, Depressed, Hopeless and Irritable  Affect:  Congruent  Thought Process:  Coherent  Orientation:  Full  Thought Content:  WDL  Suicidal Thoughts:  No  Homicidal Thoughts:  No  Memory:  Immediate;   Fair Recent;   Good Remote;   Fair  Judgement:  Impaired  Insight:  Lacking  Psychomotor Activity:  Normal  Concentration:  Fair  Recall:  Fair  Akathisia:  No  Handed:  Right  AIMS (if indicated):     Assets:  Communication Skills Desire for Improvement  Sleep:  Number of Hours: 6.25    ROS: Neuro: noting headaches probably related to the lack of sleep, and some dizziness,  GI: noting D, but no N/V/cramps/constipation, will prescribe Imodium for that.  MS: denies weakness, muscle cramps, aches  Integument: notes a persistent infection on the (R) neck that the triple antibiotic didn't help.  He asks for Amoxil that had helped in the past.  Vital Signs:Blood pressure 148/98, pulse 57, temperature  97.1 F (36.2 C), temperature source Oral, resp. rate 16, height 5' 10.5" (1.791 m), weight 83.462 kg (184 lb). Current Medications: Current Facility-Administered Medications  Medication Dose Route Frequency Provider Last Rate Last Dose  . acetaminophen (TYLENOL) tablet 650 mg  650 mg Oral Q6H PRN Curlene Labrum Readling, MD   650 mg at 09/01/11 1654  . alum & mag hydroxide-simeth (MAALOX/MYLANTA) 200-200-20 MG/5ML suspension 30 mL  30 mL Oral Q4H PRN Curlene Labrum Readling, MD      . amoxicillin (AMOXIL) capsule 500 mg  500 mg Oral Q8H Mike Craze, MD   500 mg at 09/02/11 1317  . citalopram (CELEXA) tablet 10 mg  10 mg Oral Daily Mike Craze, MD   10 mg at 09/02/11 1610  . hydrochlorothiazide (HYDRODIURIL) tablet 25 mg  25 mg Oral Daily Curlene Labrum Readling, MD   25 mg at 09/02/11 0739  . hydrOXYzine (ATARAX/VISTARIL) tablet 50 mg  50 mg Oral QHS,MR X 1 Mickie D. Adams, PA   50 mg at 09/01/11 2132  . lisinopril (PRINIVIL,ZESTRIL) tablet 20 mg  20 mg Oral Daily Curlene Labrum Readling, MD   20 mg at 09/02/11 0739  . magnesium hydroxide (MILK OF MAGNESIA) suspension 30 mL  30 mL Oral Daily PRN Curlene Labrum Readling, MD      . neomycin-bacitracin-polymyxin (NEOSPORIN) ointment   Topical TID Mike Craze, MD   1 application at 09/02/11 [phone]  . simvastatin (  ZOCOR) tablet 20 mg  20 mg Oral QHS Curlene Labrum Readling, MD   20 mg at 09/01/11 2132  . vitamin B-12 (CYANOCOBALAMIN) tablet 100 mcg  100 mcg Oral Daily Curlene Labrum Readling, MD   100 mcg at 09/02/11 1610    Lab Results:  No results found for this or any previous visit (from the past 48 hour(s)).  Physical Findings: AIMS: Facial and Oral Movements Muscles of Facial Expression: None, normal Lips and Perioral Area: None, normal Jaw: None, normal Tongue: None, normal,Extremity Movements Upper (arms, wrists, hands, fingers): None, normal Lower (legs, knees, ankles, toes): None, normal, Trunk Movements Neck, shoulders, hips: None, normal, Overall Severity Severity of  abnormal movements (highest score from questions above): None, normal Incapacitation due to abnormal movements: None, normal Patient's awareness of abnormal movements (rate only patient's report): No Awareness, Dental Status Current problems with teeth and/or dentures?: No Does patient usually wear dentures?: No  CIWA:  CIWA-Ar Total: 1  COWS:  COWS Total Score: 0   Treatment Plan Summary: Daily contact with patient to assess and evaluate symptoms and progress in treatment Medication management Mood/anxiety less than 3/10 where the scale is 1 is the best and 10 is the worst No suicidal or homicidal thoughts for at least 48 hours.  Plan: Pt again challenged to consider listening to advice from others.  He was reminded that he projects a very hard solid persona like that of the rock of [place].  He seemed to extrapolate from that the he needed to really change the way he carries himself.  He plans to open up with his family and in group this weekend.  Lenore Moyano 09/02/2011, 4:43 PM

## 2011-09-02 NOTE — Progress Notes (Addendum)
D: Pt was greeted in the hallway. Pt was pleasant and expressed no concerns he wished for this writer to address at this time. Pt also declined needing any info on his medications or an update on his discharge. Pt is still experiencing passive SI. Pt contracts for safety at this time. A: Continued support and availability as needed was extended to this pt. q8min checks remains for this pt.  R: Pt remains safe at this time.

## 2011-09-03 NOTE — Progress Notes (Signed)
[facility] Group Notes:  (Counselor/Nursing/MHT/Case Management/Adjunct)  09/03/2011 11:58 PM  Type of Therapy:  Wrap up Group  Participation Level:  Active  Participation Quality:  Appropriate, Attentive and Sharing  Affect:  Appropriate  Cognitive:  Alert, Appropriate and Oriented  Insight:  Good  Engagement in Group:  Good  Engagement in Therapy:  Good  Modes of Intervention:  Wrap up Group/Discussion  Summary of Progress/Problems: Pt attended group and shared that he was able to attend the life skills group earlier in the day and this helped him to recognize some obstacles that he is facing and put him in touch with his feelings. Pt was encouraged to continue to explore the obstacles and options to overcome them and rated his hopelessness at an 8 and his depression at an 8. He also shared that it was a positive thing that he was able to get some rest today. Even though he is still feeling down, he is determined to "continue to press on."   Bevely Palmer 09/03/2011, 11:58 PM

## 2011-09-03 NOTE — Progress Notes (Signed)
  [name] is a 56 y.o. male [id number] Apr 26, 1955  08/30/2011 Principal Problem:  *Major depressive disorder, recurrent episode   Mental Status:  Alert mood is good denies SI/HI/AVH.  Subjective/Objective:  Was in bed but easily aroused. Slept better denied a need to change/adjust meds.   Filed Vitals:   09/03/11 0625  BP: 134/93  Pulse: 61  Temp: 97.7 F (36.5 C)  Resp: 16    Lab Results:   BMET    Component Value Date/Time   NA 140 08/29/2011 2206   K 4.0 08/29/2011 2206   CL 103 08/29/2011 2206   CO2 29 08/29/2011 2206   GLUCOSE 91 08/29/2011 2206   BUN 15 08/29/2011 2206   CREATININE 1.19 08/29/2011 2206   CALCIUM 9.4 08/29/2011 2206   GFRNONAA 68* 08/29/2011 2206   GFRAA 78* 08/29/2011 2206    Medications:  Scheduled:     . amoxicillin  500 mg Oral Q8H  . citalopram  20 mg Oral Daily  . hydrochlorothiazide  25 mg Oral Daily  . hydrOXYzine  50 mg Oral QHS,MR X 1  . lisinopril  20 mg Oral Daily  . neomycin-bacitracin-polymyxin   Topical TID  . simvastatin  20 mg Oral QHS  . vitamin B-12  100 mcg Oral Daily  . DISCONTD: citalopram  10 mg Oral Daily     PRN Meds acetaminophen, alum & mag hydroxide-simeth, loperamide, magnesium hydroxide  Plan: continue current plan of care.  Alegra Rost,MICKIE D. 09/03/2011

## 2011-09-03 NOTE — Progress Notes (Signed)
D) Pt tends to stay to himself and does not engage with the other Patients. Rates his depression and hopelessness both at a 10 and states he continues to have thoughts of suicide. When speaking with Pt. He says that he is not handling the diagnosis of prostrate cancer very well "that's why I am here". Has difficulty talking about it. Family does not know about the diagnosis. Stated that both of his grandfathers died from the same diagnosis. The last one died in 2001/06/11. States that he has gotten 3 opinions from 3 different doctors and they all say the same thing. All of Pt's reports are at his home (states he lives alone). And is unable to obtain them at this time so that they cell type can be identified and information obtained. A) Encouraging Pt to talk about his feelings. Also getting information so that he can understand that this is a treatable illness when caught early. (Pt stated that he experience symptoms for a couple of months before seeing a doctor). Given support and reassurance. R) Pt thought it a good idea to be more educated about this illness.

## 2011-09-03 NOTE — Progress Notes (Signed)
[facility] Group Notes:  (Counselor/Nursing/MHT/Case Management/Adjunct)  09/03/2011 11:55 AM  Type of Therapy:  After Care Planning group  Pt. Attended After care planning group and was given SI pamphlet and crisis holt line numbers and agreed to use them if needed. Pt. Was also given the workbook for today which deals with Healthy Coping skills. Each pt. Was encouraged to look through the book and complete the activities in them. Pt. spoke about having health problems and that his depression is what brought him to the hospital  Pt. Stated he saw Dr. Dan Humphreys yesterday and that they are still working on his medications and d/c date(possibly next week). Pt. Denied SI/HI today.   [name] Mulga 09/03/2011, 11:55 AM

## 2011-09-03 NOTE — Progress Notes (Signed)
[facility] Group Notes:  (Counselor/Nursing/MHT/Case Management/Adjunct)  09/03/2011 6:06 PM  ype of Therapy:  Group Therapy  Participation Level:  Did Not Attend   Neila Gear 09/03/2011, 6:06 PM

## 2011-09-03 NOTE — Progress Notes (Signed)
Psychoeducational Group Note  Date:  09/03/2011 Time:  1515  Group Topic/Focus:  Healthy Communication:   The focus of this group is to discuss communication, barriers to communication, as well as healthy ways to communicate with others.  Participation Level:  Did Not Attend  Participation Quality:    Affect:    Cognitive:    Insight:    Engagement in Group:    Additional Comments:  Pt was sleeping.  Isla Pence M 09/03/2011, 4:03 PM

## 2011-09-04 MED ORDER — CARBAMAZEPINE 200 MG PO TABS
200.0000 mg | ORAL_TABLET | Freq: Two times a day (BID) | ORAL | Status: DC
  Administered 2011-09-04 – 2011-09-06 (×4): 200 mg via ORAL
  Filled 2011-09-04: qty 28
  Filled 2011-09-04: qty 1
  Filled 2011-09-04: qty 28
  Filled 2011-09-04 (×9): qty 1

## 2011-09-04 NOTE — Progress Notes (Signed)
[facility] Group Notes:  (Counselor/Nursing/MHT/Case Management/Adjunct)  09/04/2011 11:35 AM  Type of Therapy:  After Care Planning Group  Pt. participated in after care planning group and was given [facility] SI pamphlet and crisis hotline numbers. Each pt.  agreed to use them if needed. Patients in the group were also given  Information about the [facility] and a list of free support groups located in [place]. The pt.  Spoke about seeing PA and no changes were made to medication. Pt. denied SI/HI today.  [name] 09/04/2011, 11:35 AM

## 2011-09-04 NOTE — Progress Notes (Signed)
D) Pt has attended the groups and interacts with select peers. Affect is flat, mood depressed. States he does not want to talk about hid diagnosis of prostrate cancer because it makes him depressed, yet was able to share some of his feelings. States that he honestly doesn't know if he wants to take his own life. He feels out of control. He stated that he has made a choice that he is going to have the surgery. He is also going to speak with his family next week and share with them his diagnosis. States he and his wife have been thinking of getting back together. Feels that the family will be very supportive to him. Stated that he did not want to talk about the stage cancer he has because it would "make me very emotional". A) Provided with a 1:1. Given support. Active listening. Given more information and materials on prostrate cancer for Pt to look at and absorb.  Encouraged to think about what type of Legacy he wants to leave for his family. R) Pt rates his depression and hopelessness both at a 10 and admits to SI. Is safe within the hospital.

## 2011-09-04 NOTE — Progress Notes (Signed)
[facility] Family/Significant Other Suicide Prevention Education  Suicide Prevention Education:  Patient Refusal for Family/Significant Other Suicide Prevention Education: The patient [name] has refused to provide written consent for family/significant other to be provided Family/Significant Other Suicide Prevention Education during admission and/or prior to discharge.  Physician notified.  Lamar Blinks [place] 09/04/2011, 11:34 AM

## 2011-09-04 NOTE — Progress Notes (Signed)
[facility] MD Progress Note  09/04/2011 3:18 PM  S: "I have been here x 4 days. I'm still depressed. My depression is @ #8. I am still having suicidal thoughts, no plans. I am not sleeping well. My depression and mood is up and down".  Diagnosis:   Axis I: Major depressive disorder, recurrent episode Axis II: Deferred Axis III:  Past Medical History  Diagnosis Date  . Hypertension   . Arthritis     knees  . Prostate cancer    Axis IV: other psychosocial or environmental problems Axis V: 41-50 serious symptoms  ADL's:  Intact  Sleep: Poor  Appetite:  Fair  Suicidal Ideation: "Yes" Plan:  No Intent:  No Means:  No Homicidal Ideation:  Plan:  No Intent:  no Means:  no  AEB (as evidenced by): per patient's reports.  Mental Status Examination/Evaluation: Objective:  Appearance: Casual  Eye Contact::  Good  Speech:  Clear and Coherent  Volume:  Normal  Mood:  Depressed  Affect:  Flat  Thought Process:  Coherent  Orientation:  Full  Thought Content:  Rumination  Suicidal Thoughts:  Yes.  without intent/plan  Homicidal Thoughts:  No  Memory:  Immediate;   Good Recent;   Good Remote;   Good  Judgement:  Fair  Insight:  Fair  Psychomotor Activity:  Normal  Concentration:  Good  Recall:  Good  Akathisia:  No  Handed:  Right  AIMS (if indicated):     Assets:  Desire for Improvement  Sleep:  Number of Hours: 6.5    Vital Signs:Blood pressure 148/103, pulse 69, temperature 98 F (36.7 C), temperature source Oral, resp. rate 16, height 5' 10.5" (1.791 m), weight 83.462 kg (184 lb). Current Medications: Current Facility-Administered Medications  Medication Dose Route Frequency Provider Last Rate Last Dose  . acetaminophen (TYLENOL) tablet 650 mg  650 mg Oral Q6H PRN Curlene Labrum Readling, MD   650 mg at 09/01/11 1654  . alum & mag hydroxide-simeth (MAALOX/MYLANTA) 200-200-20 MG/5ML suspension 30 mL  30 mL Oral Q4H PRN Curlene Labrum Readling, MD      . amoxicillin (AMOXIL) capsule  500 mg  500 mg Oral Q8H Mike Craze, MD   500 mg at 09/04/11 1416  . citalopram (CELEXA) tablet 20 mg  20 mg Oral Daily Mike Craze, MD   20 mg at 09/04/11 0753  . hydrochlorothiazide (HYDRODIURIL) tablet 25 mg  25 mg Oral Daily Curlene Labrum Readling, MD   25 mg at 09/04/11 0753  . hydrOXYzine (ATARAX/VISTARIL) tablet 50 mg  50 mg Oral QHS,MR X 1 Mickie D. Adams, PA   50 mg at 09/03/11 2151  . lisinopril (PRINIVIL,ZESTRIL) tablet 20 mg  20 mg Oral Daily Curlene Labrum Readling, MD   20 mg at 09/04/11 0753  . loperamide (IMODIUM) capsule 2 mg  2 mg Oral PRN Mike Craze, MD      . magnesium hydroxide (MILK OF MAGNESIA) suspension 30 mL  30 mL Oral Daily PRN Curlene Labrum Readling, MD      . neomycin-bacitracin-polymyxin (NEOSPORIN) ointment   Topical TID Mike Craze, MD   1 application at 09/02/11 [phone]  . simvastatin (ZOCOR) tablet 20 mg  20 mg Oral QHS Curlene Labrum Readling, MD   20 mg at 09/03/11 2150  . vitamin B-12 (CYANOCOBALAMIN) tablet 100 mcg  100 mcg Oral Daily Curlene Labrum Readling, MD   100 mcg at 09/04/11 0753    Lab Results: No results found for this  or any previous visit (from the past 48 hour(s)).  Physical Findings: AIMS: Facial and Oral Movements Muscles of Facial Expression: None, normal Lips and Perioral Area: None, normal Jaw: None, normal Tongue: None, normal,Extremity Movements Upper (arms, wrists, hands, fingers): None, normal Lower (legs, knees, ankles, toes): None, normal, Trunk Movements Neck, shoulders, hips: None, normal, Overall Severity Severity of abnormal movements (highest score from questions above): None, normal Incapacitation due to abnormal movements: None, normal Patient's awareness of abnormal movements (rate only patient's report): No Awareness, Dental Status Current problems with teeth and/or dentures?: No Does patient usually wear dentures?: No  CIWA:  CIWA-Ar Total: 0  COWS:  COWS Total Score: 0   Treatment Plan Summary: Daily contact with patient to assess  and evaluate symptoms and progress in treatment Medication management  Plan: Initiate tegretol 200 mg bid for mood stabilization. Obtain Tegretol levels. Continue current treatment plan.   Armandina Stammer I 09/04/2011, 3:18 PM

## 2011-09-04 NOTE — Progress Notes (Signed)
[facility] Group Notes:  (Counselor/Nursing/MHT/Case Management/Adjunct)  09/04/2011 6:13 PM  Type of Therapy:  Group Therapy  Participation Level:  Active  Participation Quality:  Appropriate, Attentive and Sharing  Affect:  Appropriate  Cognitive:  Appropriate  Insight:  Limited  Engagement in Group:  Good  Engagement in Therapy:  Good  Modes of Intervention:  Clarification, Education, Socialization and Support  Summary of Progress/Problems: Pt. participated in group on healthy supports. Each pt. Identified supports in their lives and group discussed the difference between health and unhealthy supports. The  Group members participated in group activity on what it feels to feel support verses just having someone verbally tell you they will support you. Also each member of the group was encouraged to learn how to support themselves when their supports are not around. The pt. Spoke about his children being a support. Pt. Also talked about his minister being a support and  How prayer would enable him to support himself when his supports were not around.  Lamar Blinks [place] 09/04/2011, 6:13 PM

## 2011-09-04 NOTE — Progress Notes (Signed)
Patient ID: [name], male   DOB: 20-Oct-1955, 56 y.o.   MRN: [id number] D)  Has been quiet all evening, very little interaction noted with staff or peers. Did attend group and said his day had been fairly good, but remains flat, sad.  Came to med window for hs meds but forwards little, answers are short and minimal.  A) Will continue to try to develop more of a rapport, will continue to monitor q 15 minutes for safety.

## 2011-09-04 NOTE — Progress Notes (Signed)
Psychoeducational Group Note  Date:  09/04/2011 Time: 1015 Group Topic/Focus:  Making Healthy Choices:   The focus of this group is to help patients identify negative/unhealthy choices they were using prior to admission and identify positive/healthier coping strategies to replace them upon discharge.  Participation Level:  Active  Participation Quality:  Attentive  Affect:  Blunted  Cognitive:  Alert  Insight:  Good  Engagement in Group:  Good  Additional Comments:    Rich Brave 2:13 PM. 09/04/2011

## 2011-09-05 NOTE — Progress Notes (Signed)
Psychoeducational Group Note  Date:  09/05/2011 Time: 2000  Group Topic/Focus:  Goals Group:   The focus of this group is to help patients establish daily goals to achieve during treatment and discuss how the patient can incorporate goal setting into their daily lives to aide in recovery.  Participation Level:  Active  Participation Quality:  Appropriate  Affect:  Appropriate  Cognitive:  Appropriate  Insight:  Good  Engagement in Group:  Good  Additional Comments:  Patient appeared to be relaxed as well as his tone of voice. Patient stated that he has had a pleasant day. Patient stated that he has written in his journal, prayed, attended groups which he believes helped him realize today that  "its time to go home." Patient also stated that "[name] been here for seven days now."  [name] 09/05/2011, 9:19 PM

## 2011-09-05 NOTE — Progress Notes (Signed)
Psychoeducational Group Note  Date:  09/05/2011 Time:  1100   Group Topic/Focus:  Self Care:   The focus of this group is to help patients understand the importance of self-care in order to improve or restore emotional, physical, spiritual, interpersonal, and financial health.  Participation Level:  Active  Participation Quality:  Appropriate, Sharing and Supportive  Affect:  Appropriate  Cognitive:  Appropriate  Insight:  Good  Engagement in Group:  Good  Additional Comments:  Patient was very appropriate in sharing and willing to express himself and very open in expressing himself and his situation in regards to being a patient as well as his hopes as going home.    Dahlia Client Lyn 09/05/2011, 1:38 PM

## 2011-09-05 NOTE — Progress Notes (Signed)
Patient ID: [name], male   DOB: 12-Apr-1955, 56 y.o.   MRN: [id number] D)  Has been a little brighter this evening, pleasant , but still guarded and quiet.  Attended group, said he had a pretty good day.  Had reported to RN on previous shift that he has been talking to his wife and that she may be agreeable to getting back together.  Seemed hopeful and actually smiled a tiny smile.  A) Will continue to offer support and encouragement, and try to build rapport.  Will continue to monitor q 15 minutes for safety, continue POC.

## 2011-09-05 NOTE — Progress Notes (Signed)
Patient ID: [name], male   DOB: [birth year], 56 y.o.   MRN: [id number] D: Pt. Reports "I'm to be discharged tomorrow". "I think I'm strong enough and bad enough" "you heard that before that's the theme song from Shaft" " I been journaling, praying, going to groups and listening to a a lot or advice, from staff and other clients" "I been like a sponge taking it all" Pt. Currently denies SHI. A: Staff will monitor q55min for safety. Staff encourages group. R: Pt. Is safe on the unit. Pt. Attends group and also visible on the unit.

## 2011-09-05 NOTE — Progress Notes (Signed)
[facility] MD Progress Note  09/05/2011 12:26 PM  S: "My depression is #10, and my suicidal thoughts is at #10.  I just don't have a plan to do it. Today is the June 11, 2022 anniversary death of my mother.  I feel like bumping my head on the wall, but I will not do that to myself. The staff has been supportive. My depression started after I was diagnosed with prostate cancer not too long ago. I know by tomorrow, my depression will get better, and I will be discharged then. I have prostate surgery scheduled onThursday at a hospital in [place]. My wife and family did not know about my cancer issues. They did not know that I am here either. I planned on telling my wife about my prostate cancer once I leave this hospital. I will take her to a quiet place, it will be only 2 of [place], and I will tell her then".   Diagnosis:   Axis I: Major depressive disorder, recurrent episode Axis II: Deferred Axis III:  Past Medical History  Diagnosis Date  . Hypertension   . Arthritis     knees  . Prostate cancer    Axis IV: other psychosocial or environmental problems and health concerns Axis V: 11-20 some danger of hurting self or others possible OR occasionally fails to maintain minimal personal hygiene OR gross impairment in communication  ADL's:  Intact  Sleep: Good  Appetite:  Good  Suicidal Ideation: "Yes" Plan:  No Intent:  No Means:  no Homicidal Ideation: "NO" Plan:  No Intent:  no Means:  no  AEB (as evidenced by): per patient's reports.  Mental Status Examination/Evaluation: Objective:  Appearance: Casual  Eye Contact::  Good  Speech:  Clear and Coherent  Volume:  Normal  Mood:  Depressed, rated depression at #10  Affect:  Non-Congruent with mood  Thought Process:  Coherent  Orientation:  Full  Thought Content:  Rumination  Suicidal Thoughts:  Yes.  without intent/plan  Homicidal Thoughts:  No  Memory:  Immediate;   Good Recent;   Good Remote;   Good  Judgement:  Other:  difficult to  assess at times, patient's stories seem vague most times.  Insight:  Lacking  Psychomotor Activity:  Normal  Concentration:  Good  Recall:  Good  Akathisia:  No  Handed:  Right  AIMS (if indicated):     Assets:  Desire for Improvement  Sleep:  Number of Hours: 6.75    Vital Signs:Blood pressure 137/93, pulse 61, temperature 98.4 F (36.9 C), temperature source Oral, resp. rate 20, height 5' 10.5" (1.791 m), weight 83.462 kg (184 lb). Current Medications: Current Facility-Administered Medications  Medication Dose Route Frequency Provider Last Rate Last Dose  . acetaminophen (TYLENOL) tablet 650 mg  650 mg Oral Q6H PRN Ronny Bacon, MD   650 mg at 09/05/11 0846  . alum & mag hydroxide-simeth (MAALOX/MYLANTA) 200-200-20 MG/5ML suspension 30 mL  30 mL Oral Q4H PRN Curlene Labrum Readling, MD      . amoxicillin (AMOXIL) capsule 500 mg  500 mg Oral Q8H Mike Craze, MD   500 mg at 09/05/11 0617  . carbamazepine (TEGRETOL) tablet 200 mg  200 mg Oral BID Sanjuana Kava, NP   200 mg at 09/05/11 0751  . citalopram (CELEXA) tablet 20 mg  20 mg Oral Daily Mike Craze, MD   20 mg at 09/05/11 0750  . hydrochlorothiazide (HYDRODIURIL) tablet 25 mg  25 mg Oral Daily Randy D Readling,  MD   25 mg at 09/05/11 0749  . hydrOXYzine (ATARAX/VISTARIL) tablet 50 mg  50 mg Oral QHS,MR X 1 Mickie D. Adams, PA   50 mg at 09/04/11 2129  . lisinopril (PRINIVIL,ZESTRIL) tablet 20 mg  20 mg Oral Daily Curlene Labrum Readling, MD   20 mg at 09/05/11 0750  . loperamide (IMODIUM) capsule 2 mg  2 mg Oral PRN Mike Craze, MD      . magnesium hydroxide (MILK OF MAGNESIA) suspension 30 mL  30 mL Oral Daily PRN Curlene Labrum Readling, MD      . neomycin-bacitracin-polymyxin (NEOSPORIN) ointment   Topical TID Mike Craze, MD   1 application at 09/05/11 1157  . simvastatin (ZOCOR) tablet 20 mg  20 mg Oral QHS Mike Craze, MD   20 mg at 09/04/11 2129  . vitamin B-12 (CYANOCOBALAMIN) tablet 100 mcg  100 mcg Oral Daily Curlene Labrum  Readling, MD   100 mcg at 09/05/11 0750    Lab Results: No results found for this or any previous visit (from the past 48 hour(s)).  Physical Findings: AIMS: Facial and Oral Movements Muscles of Facial Expression: None, normal Lips and Perioral Area: None, normal Jaw: None, normal Tongue: None, normal,Extremity Movements Upper (arms, wrists, hands, fingers): None, normal Lower (legs, knees, ankles, toes): None, normal, Trunk Movements Neck, shoulders, hips: None, normal, Overall Severity Severity of abnormal movements (highest score from questions above): None, normal Incapacitation due to abnormal movements: None, normal Patient's awareness of abnormal movements (rate only patient's report): No Awareness, Dental Status Current problems with teeth and/or dentures?: No Does patient usually wear dentures?: No  CIWA:  CIWA-Ar Total: 0  COWS:  COWS Total Score: 0   Treatment Plan Summary: Daily contact with patient to assess and evaluate symptoms and progress in treatment Medication management  Plan: No changes made on current treatment regimen. Continue current treatment plan.  Armandina Stammer I 09/05/2011, 12:26 PM

## 2011-09-05 NOTE — Progress Notes (Addendum)
(  D) Patient states that he slept poorly last night. Record reflects 6.75 hours but reports it as not being restful and he had a difficult time falling to sleep. He reports appetite improving, energy level low, ability to pay attention poor. Rates depression at 9/9 and hopelessness at 9/9. Physical complaints states as some diarrhea and chilling. Patient passively suicidal and verbally contracts for safety. He is able to verbalize that he will need to remain on medications at discharge to maintain wellness. He presents with flat, sad affect with brief eye contact. He is physically in milieu but minimal interaction with peers. (A) Patient encouraged to seek staff for support and encouragement. Patient encouraged to identify supportive people in his life to maintain open communication for positive support. (R) Patient cooperative.  Joice Lofts RN MS EdS 09/05/2011  8:39 AM

## 2011-09-05 NOTE — Progress Notes (Signed)
Psychoeducational Group Note  Date:  09/05/2011 Time:  2000  Group Topic/Focus:  Wrap-Up Group:   The focus of this group is to help patients review their daily goal of treatment and discuss progress on daily workbooks.  Participation Level:  Active  Participation Quality:  Appropriate  Affect:  Appropriate  Cognitive:  Appropriate  Insight:  Good  Engagement in Group:  Good  Additional Comments:  Patient attended and participated in group tonight. He reports that he has a pleasant day. He wrote in his journal, attended group. He has been reflecting on health issues that he is dealing with and decided to tell family and friends of his health issue.  He advised that his family and friends are his support systems  Scot Dock 09/05/2011, 4:04 AM

## 2011-09-06 MED ORDER — CITALOPRAM HYDROBROMIDE 20 MG PO TABS: 20.0000 mg | ORAL_TABLET | Freq: Every day | ORAL | Status: AC

## 2011-09-06 MED ORDER — LISINOPRIL 20 MG PO TABS: 20.0000 mg | ORAL_TABLET | Freq: Every day | ORAL | Status: AC

## 2011-09-06 MED ORDER — CARBAMAZEPINE 200 MG PO TABS
200.0000 mg | ORAL_TABLET | Freq: Two times a day (BID) | ORAL | Status: AC
Start: 2011-09-06 — End: 2012-09-05

## 2011-09-06 MED ORDER — PRAVASTATIN SODIUM 40 MG PO TABS: 40.0000 mg | ORAL_TABLET | Freq: Every day | ORAL | Status: AC

## 2011-09-06 MED ORDER — HYDROCHLOROTHIAZIDE 25 MG PO TABS: 25.0000 mg | ORAL_TABLET | Freq: Every day | ORAL | Status: AC

## 2011-09-06 MED ORDER — AMOXICILLIN 500 MG PO CAPS: 500.0000 mg | ORAL_CAPSULE | Freq: Three times a day (TID) | ORAL | Status: AC

## 2011-09-06 MED ORDER — CYANOCOBALAMIN 100 MCG PO TABS: 100.0000 ug | ORAL_TABLET | Freq: Every day | ORAL | Status: AC

## 2011-09-06 MED ORDER — HYDROXYZINE HCL 50 MG PO TABS: 50.0000 mg | ORAL_TABLET | Freq: Every evening | ORAL | Status: AC | PRN

## 2011-09-06 NOTE — Discharge Summary (Signed)
Physician Discharge Summary Note  Patient:  [name] is an 56 y.o., male MRN:  [id number] DOB:  [birth year] Patient phone:  [phone] (home)  Patient address:   [address] [place] [address]   Date of Admission:  08/30/2011 Date of Discharge: 09/06/2011  Discharge Diagnoses: Principal Problem:  *Major depressive disorder, recurrent episode  Axis Diagnosis:  Axis I: Major depressive disorder, recurrent episode  Axis II: Deferred  Axis III:  Past Medical History   Diagnosis  Date   .  Hypertension    .  Arthritis      knees   .  Prostate cancer     Axis IV: other psychosocial or environmental problems and health concerns  Axis V:  61-70 mild symptoms  Level of Care:  OP  Hospital Course:   Time was spent today discussing with the patient his current symptoms. The patient states that he is having significant difficulty initiating and maintaining sleep. He also reports a poor appetite with a 10 lb weight loss in the last 2 weeks. The patient reports severe feelings of sadness, anhedonia and depressed mood. He denies any homicidal ideations but states he is experiencing suicidal ideations today but with no plan or intent. He denies any auditory or visual hallucinations or delusional thinking. The patient also reports severe anxiety symptoms.  The patient states that he was diagnoses with stage 2 prostate cancer approximately 2 weeks ago. Since that time the patient states that his depression has worsened to where he feels inpatient hospitalization is necessary.   While a patient in this hospital, [name] received medication management for depression, anxiety, and insomnia. They were ordered and received Celexa, Tegretol, and Vistaril along with Amoxil for these conditions and a skin condition. They were also enrolled in group counseling sessions and activities in which they participated actively.   Patient attended treatment team meeting this am and met with  treatment team members. Pt symptoms, treatment plan and response to treatment discussed. [name] endorsed that their symptoms have improved. Pt also stated that they are stable for discharge.  They reported that from this hospital stay they had learned that when they start to shut down that they harm themselves in several arenas of their life.  In other to maintain their mood and anxiety as well as sleep wake cycle, they will continue psychiatric care on outpatient basis. They will follow-up at [facility] on 9/10 with Tamera Punt at 1300 and to follow up with [facility] walk in clinic on 9/4 between 0800 and 1500.  In addition they were instructed to follow up with a dermatologist for their skin condition, follow up with proscriber on low GFR and low H/H and that Tegretol accelerates the metabolism of several medications and levels need to be drawn, to take all your medications as prescribed by your mental healthcare provider, to report any adverse effects and or reactions from your medicines to your outpatient provider promptly, patient is instructed and cautioned to not engage in alcohol and or illegal drug use while on prescription medicines, in the event of worsening symptoms, patient is instructed to call the crisis hotline, 911 and or go to the nearest ED for appropriate evaluation and treatment of symptoms.   Upon discharge, patient adamantly denies suicidal, homicidal ideations, auditory, visual hallucinations and or delusional thinking. They left [facility] with all personal belongings via personal transportation in no apparent distress.  Consults:  None  Significant Diagnostic Studies:  labs: GFR  calc low at 78 and H/H low at 12.5/38.8, rest of CMET, CBC, Acetaminophen level, BAL, UDS non contributory  Discharge Vitals:   Blood pressure 131/91, pulse 63, temperature 97.8 F (36.6 C), temperature source Oral, resp. rate 20, height 5' 10.5" (1.791 m), weight 83.462 kg (184 lb)..  Mental  Status Exam: See Mental Status Examination and Suicide Risk Assessment completed by Attending Physician prior to discharge.  Discharge destination:  Home  Is patient on multiple antipsychotic therapies at discharge:  No  Has Patient had three or more failed trials of antipsychotic monotherapy by history: N/A Recommended Plan for Multiple Antipsychotic Therapies: N/A Discharge Orders    Future Orders Please Complete By Expires   Carbamazepine Level (Tegretol), total      Comments:   TWELVE HOURS AFTER THE LAST DOSE. UNABLE TO INTERPRET IF DRAWN AT ANY OTHER TIME     Medication List  As of 09/06/2011 11:46 AM   TAKE these medications      Indication    amoxicillin 500 MG capsule   Commonly known as: AMOXIL   Take 1 capsule (500 mg total) by mouth every 8 (eight) hours. For skin infection that needs to be followed by Dermatologist       carbamazepine 200 MG tablet   Commonly known as: TEGRETOL   Take 1 tablet (200 mg total) by mouth 2 (two) times daily. For depression, anxiety, and mood control       citalopram 20 MG tablet   Commonly known as: CELEXA   Take 1 tablet (20 mg total) by mouth daily. For depression.       cyanocobalamin 100 MCG tablet   Take 1 tablet (100 mcg total) by mouth daily. For Vitamin B-12 replacement       hydrochlorothiazide 25 MG tablet   Commonly known as: HYDRODIURIL   Take 1 tablet (25 mg total) by mouth daily. For control of high blood pressure       hydrOXYzine 50 MG tablet   Commonly known as: ATARAX/VISTARIL   Take 1 tablet (50 mg total) by mouth at bedtime and may repeat dose one time if needed. For insomnia.       lisinopril 20 MG tablet   Commonly known as: PRINIVIL,ZESTRIL   Take 1 tablet (20 mg total) by mouth daily. For control of high blood pressure       pravastatin 40 MG tablet   Commonly known as: PRAVACHOL   Take 1 tablet (40 mg total) by mouth daily. To help lower cholesterol.            Follow-up Information    Follow up  with [facility] Punt [facility] on 09/13/2011. (You are scheduled with Tamera Punt at 1:00 PM)    Contact information:   [address] [place], [place]  [address]  [phone]      Follow up with [facility] on 09/07/2011. (Please go to [facility] walk in clinic on Wednesday, September 07, 2011 or any weekday between 8AM-3PM)    Contact information:   [address] [place], [place]  [address]  [phone]        Follow-up recommendations:   Activities: Resume typical activities Diet: Resume typical diet Tests: Tegretol level Other: Follow up with outpatient provider and report any side effects to out patient prescriber.  Comments:  Take all your medications as prescribed by your mental healthcare provider. Report any adverse effects and or reactions from your medicines to your outpatient provider promptly. Patient  is instructed and cautioned to not engage in alcohol and or illegal drug use while on prescription medicines. In the event of worsening symptoms, patient is instructed to call the crisis hotline, 911 and or go to the nearest ED for appropriate evaluation and treatment of symptoms.  SignedDan Humphreys, Tylan Briguglio 09/06/2011 11:46 AM

## 2011-09-06 NOTE — Progress Notes (Signed)
Psychoeducational Group Note  Date:  09/06/2011 Time:  1100  Group Topic/Focus:  Recovery Goals:   The focus of this group is to identify appropriate goals for recovery and establish a plan to achieve them.  Participation Level: Did Not Attend  Participation Quality:  Not Applicable  Affect:  Not Applicable  Cognitive:  Not Applicable  Insight:  Not Applicable  Engagement in Group: Not Applicable  Additional Comments:  Pt remained lying in bed and did not attend this group.   Sharyn Lull 09/06/2011, 11:57 AM

## 2011-09-06 NOTE — Progress Notes (Addendum)
Caidence was present in Treatment Team this morning. Also present were Dr. Orson Aloe, [facility], [place], Dawnaly Dax, Norval Gable, and this Clinical research associate.   [name] reported that he is no longer having suicidal thoughts, that he has told his wife and son about the prostate cancer diagnosis and although they are angry that he kept it from them for this long, they want to focus on moving on and getting through this. He also stated that he has learned that rather than remove the prostate, doctors want to do radiation and he has agreed to this treatment.   The question was raised about the guns that Mavrik initially reported were in his home. Jedediah replied "You know, I've been thinking about it and I don't believe there are any guns in my home anymore." He went on to explain that several months ago he had some financial obligations that he needed to meet and sold the guns. Deandrea also stated that he loves guns and may get more in the future, but at this time he does not have any. In fact, he stated that he must have subconsciously remember that there were no guns in the home and that is why he went to the hospital on the day of admission, because if he had known he had access to a gun the way he was feeling at that time, he would have gone home instead of to th hospital, and would have shot himself then.Several attempts were made to discuss a way that staff could contact anyone in his support system to verify that the guns are no longer in the home, but Thomos stated that there is no one who could do that, as he and his wife maintain separate residences and no one else has the key to his home. Trenten repeatedly refused to allow anyone in his support system to be contacted for safety or to discuss suicide prevention. Suicide prevention education was instead done with Zeplin, who verbalized understanding and stated that if he should get into crisis again he will return to the hospital.   [name], LCSW 09/06/2011  11:02 AM

## 2011-09-06 NOTE — Tx Team (Signed)
Interdisciplinary Treatment Plan Update (Adult)  Date:  09/06/2011  Time Reviewed:  9:42 AM   Progress in Treatment: Attending groups:   Yes   Participating in groups:  Yes Taking medication as prescribed:  Yes Tolerating medication:  Yes Family/Significant othe contact made: No consent given to speak with family Patient understands diagnosis:  Yes Discussing patient identified problems/goals with staff: Yes Medical problems stabilized or resolved: Yes Denies suicidal/homicidal ideation:Yes Issues/concerns per patient self-inventory:  Other:  New problem(s) identified:  Reason for Continuation of Hospitalization:  Interventions implemented related to continuation of hospitalization:  Additional comments:  Estimated length of stay: Discharge home today  Discharge Plan: Home with outpatient follow up  New goal(s):  Review of initial/current patient goals per problem list:    1.  Goal(s): Eliminate SI/other thoughts of self harm   Met:  Yes  Target date: d/c  As evidenced by: Patient no longer endorses SI/other thought self harm    2.  Goal (s):Reduce depression/anxiety   Met:  Yes  Target date: Yes  As evidenced by: Patient currently rating symptoms at four or below    3.  Goal(s): .stabilize on meds   Met:  Yes  Target date: d/c  As evidenced by: Patient reports being stabilized on medications - less symptomatic    4.  Goal(s): Refer for outpatient follow up   Met:  Yes  Target date: d/c  As evidenced by: Follow up appointment scheduled    Attendees: Patient:  [name] 09/06/2011 10:07 AM  Nursing:  Norval Gable, FNP 09/06/2011 10:07 AM  Physician:  Orson Aloe, MD 09/06/2011 9:42 AM   Nursing:   Cammy Brochure, RN 09/06/2011 9:42 AM   CaseManager:  Juline Patch, LCSW 09/06/2011 9:42 AM   Counselor:  [name], LCSW 09/06/2011 9:42 AM   Other:  Reyes Ivan, LCSWA     09/06/2011  10:08 AM

## 2011-09-06 NOTE — Progress Notes (Signed)
Psychoeducational Group Note  Date:  09/06/2011 Time:  1000  Group Topic/Focus:  Therapeutic activity   Participation Level:  Did Not Attend  Participation Quality:  Did not attend  Affect:  Appropriate  Cognitive:  Appropriate  Insight:  Did not attend  Engagement in Group:  Did not attend  Additional Comments:  Did not attend   Meredith Staggers 09/06/2011, 12:13 PM
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
## 2011-09-06 NOTE — Progress Notes (Addendum)
[facility] Case Management Discharge Plan:  Will you be returning to the same living situation after discharge: Yes,  Patient will return to his home At discharge, do you have transportation home?:Yes,  Patient has transportation home Do you have the ability to pay for your medications:No.  Patient to be assisted with indigent medications  Interagency Information:     Release of information consent forms completed and in the chart;  Patient's signature needed at discharge.  Patient to Follow up at:  Follow-up Information    Follow up with [facility] Punt - [facility] on 09/13/2011. (You are scheduled with Tamera Punt at 1:00 PM)    Contact information:   [address] [place], [place]  [address]  [phone]      Follow up with [facility] on 09/07/2011. (Please go to [facility] walk in clinic on Wednesday, September 07, 2011 or any weekday between 8AM-3PM)    Contact information:   [address] [place], [place]  [address]  [phone]         Patient denies SI/HI:   Yes,  Patient no longer endorsing SI/HI or other thoughts of self harm    Safety Planning and Suicide Prevention discussed:  Yes,  Reviewed during after care group  Barrier to discharge identified:Yes,  Patient recently learned of having prostate cancer  Summary and Recommendations: Patient encourage to be compliant with medications and follow up with outpatient recommendations.  Patient states that he does not have guns at his home at this time.  He reports selling the guns to take care of financial obligations.    Wynn Banker 09/06/2011, 10:01 AM

## 2011-09-06 NOTE — Progress Notes (Signed)
D:  Patient discharged to home today.  He denies depressive symptoms or feelings of hopelessness.  He also denies thoughts of self harm.  He collected all belongings from his room.   A:  Reviewed all discharge instructions, medications, and follow up care.  Patient was given a two week supply of medications from the hospital pharmacy.  All belongings retrieved from locker number 115.  Patient was also given a bus pass to get downtown.  He states his car is parked near the bus depot.   R:  Patient verbalized understanding of all discharge instructions.  He states that he feels much better and feels safe to be discharged at this time.

## 2011-09-12 NOTE — Progress Notes (Signed)
Patient Discharge Instructions: Incomplete consent for [facility]. Wandra Scot, 09/12/2011, 12:53 PM

## 2011-11-22 ENCOUNTER — Inpatient Hospital Stay: Payer: Self-pay | Admitting: Psychiatry

## 2011-11-22 LAB — CBC
HGB: 10.3 g/dL — ABNORMAL LOW (ref 13.0–18.0)
RBC: 3.81 10*6/uL — ABNORMAL LOW (ref 4.40–5.90)

## 2011-11-22 LAB — COMPREHENSIVE METABOLIC PANEL
Albumin: 3.4 g/dL (ref 3.4–5.0)
Alkaline Phosphatase: 37 U/L — ABNORMAL LOW (ref 50–136)
BUN: 15 mg/dL (ref 7–18)
Bilirubin,Total: 0.2 mg/dL (ref 0.2–1.0)
Creatinine: 0.92 mg/dL (ref 0.60–1.30)
EGFR (Non-African Amer.): >60
Osmolality: 278 (ref 275–301)
SGPT (ALT): 22 U/L (ref 12–78)
Sodium: 139 mmol/L (ref 136–145)
Total Protein: 6.5 g/dL (ref 6.4–8.2)

## 2011-11-22 LAB — DRUG SCREEN, URINE
Amphetamines, Ur Screen: NEGATIVE (ref ?–1000)
Cocaine Metabolite,Ur ~~LOC~~: NEGATIVE (ref ?–300)
MDMA (Ecstasy)Ur Screen: NEGATIVE (ref ?–500)
Tricyclic, Ur Screen: NEGATIVE (ref ?–1000)

## 2011-11-22 LAB — URINALYSIS, COMPLETE
Bacteria: NONE SEEN
Bilirubin,UR: NEGATIVE
Ketone: NEGATIVE
Leukocyte Esterase: NEGATIVE
Ph: 6 (ref 4.5–8.0)
Protein: NEGATIVE
RBC,UR: 2 /HPF (ref 0–5)
Squamous Epithelial: <1
WBC UR: 1 /HPF (ref 0–5)

## 2011-11-22 LAB — ACETAMINOPHEN LEVEL: Acetaminophen: <2 ug/mL

## 2012-07-24 IMAGING — CR DG FOOT COMPLETE 3+V*R*
3 series · 3 of 3 positions shown · non-contrast
Comparison: None.

CLINICAL DATA: Right foot pain

RIGHT FOOT COMPLETE - 3+ VIEW

[t foot ap right]
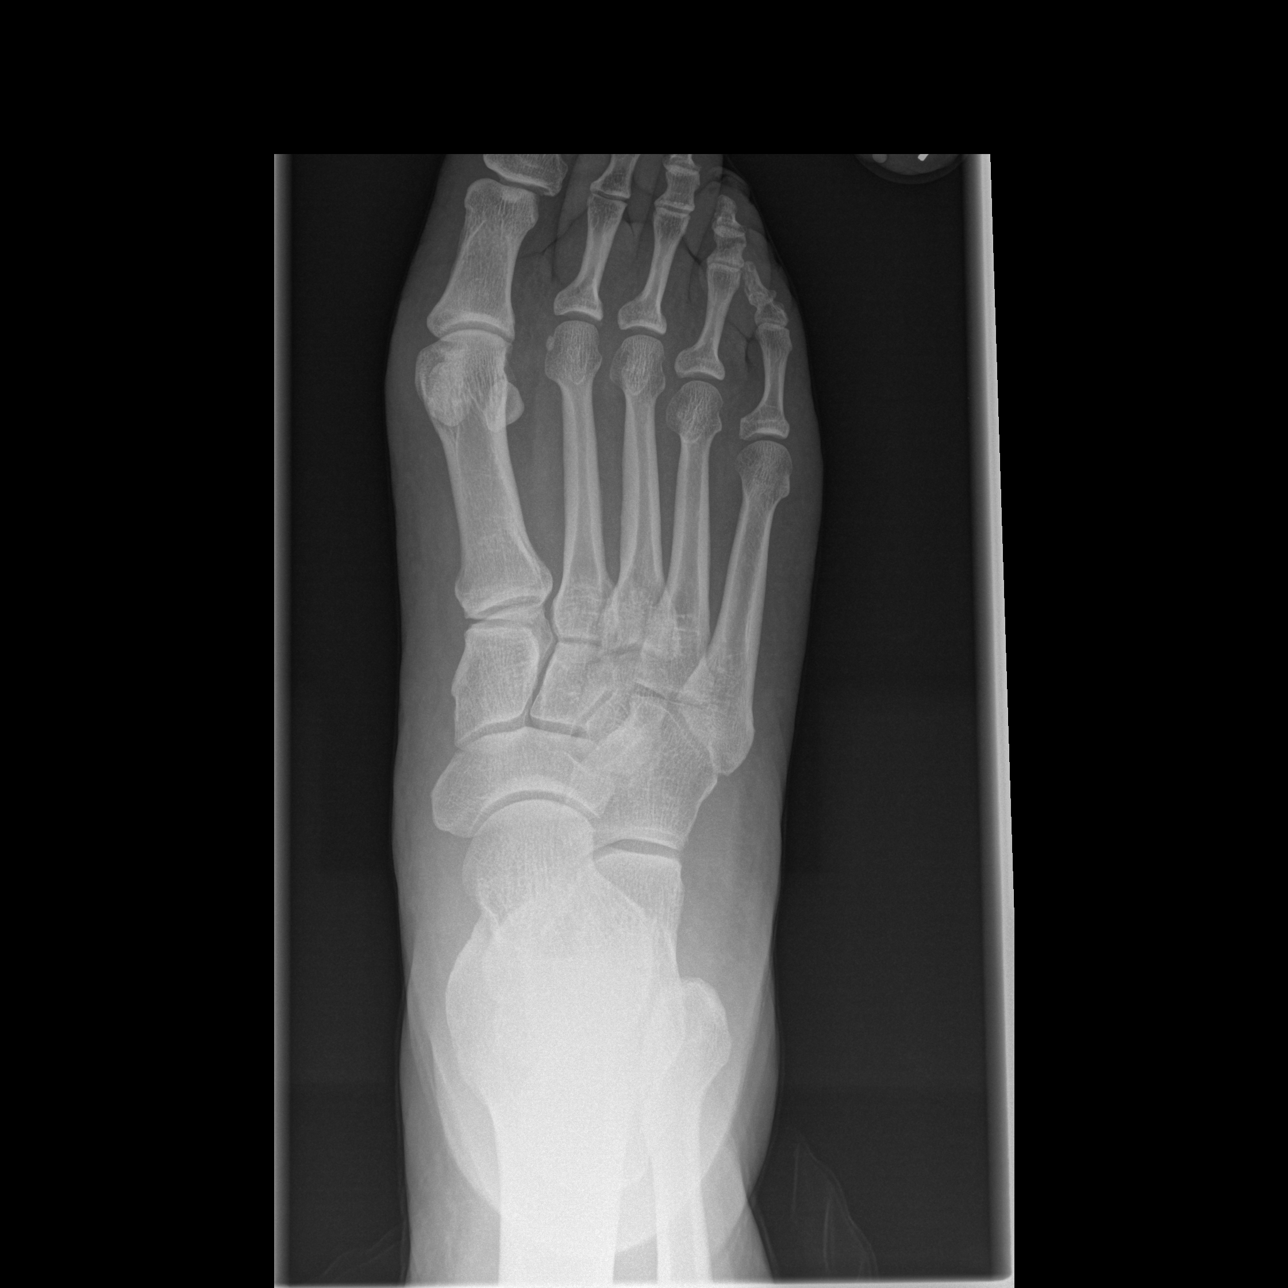

[t foot oblique right]
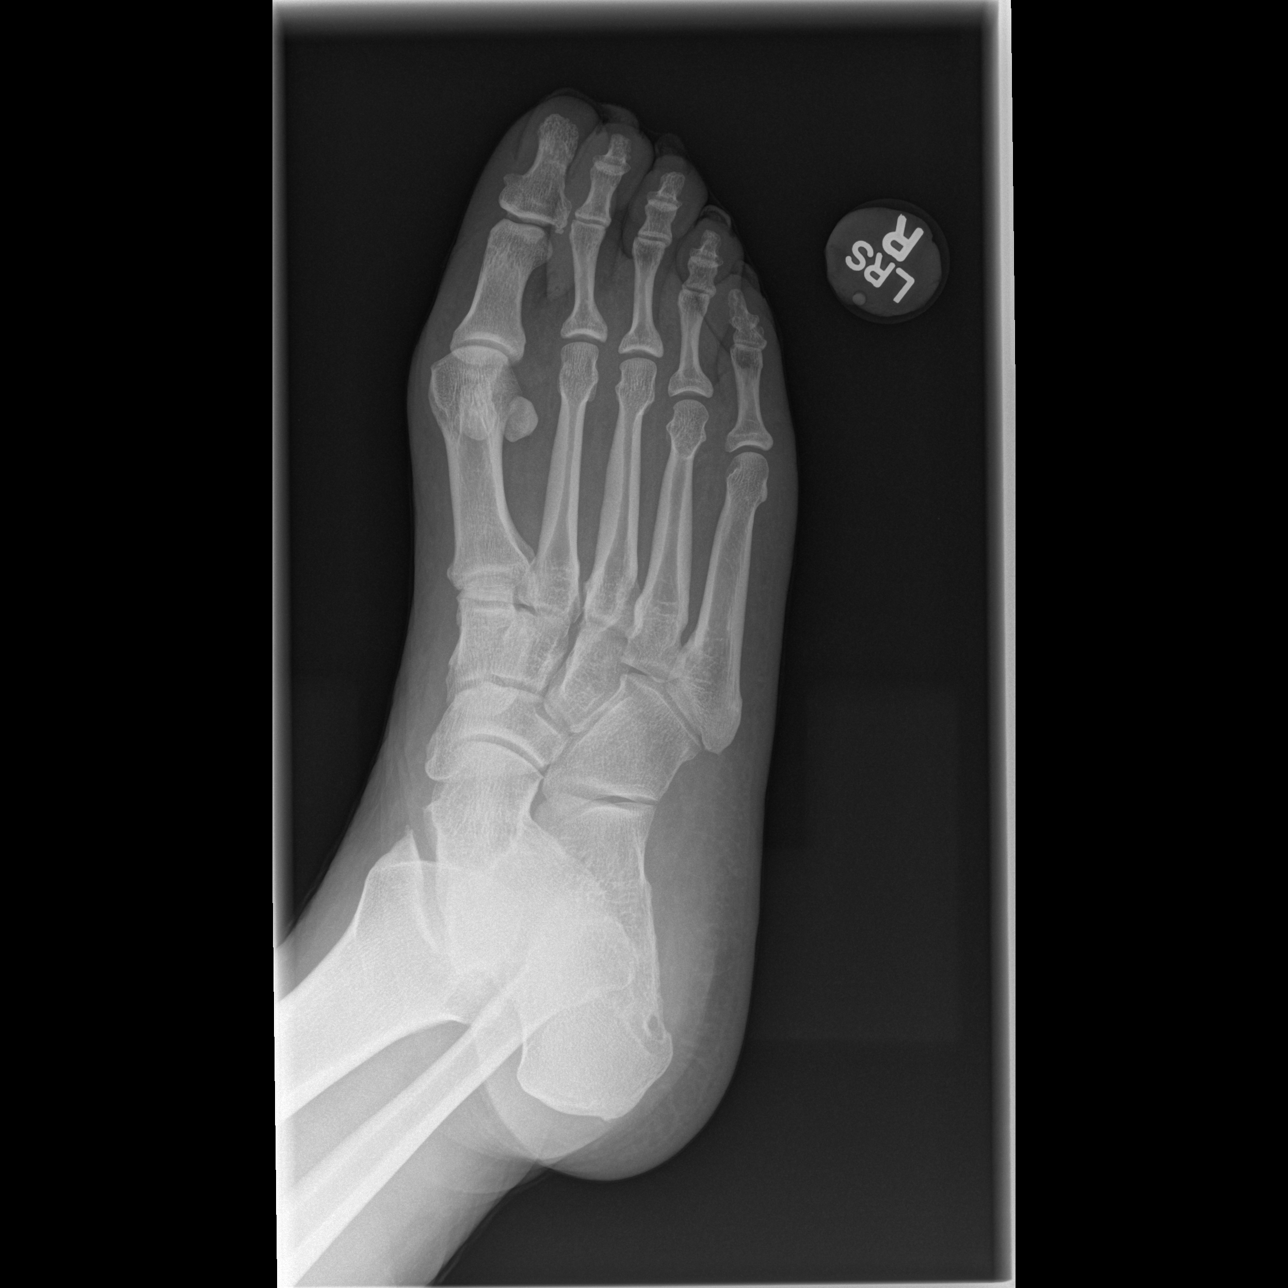

[t foot lat right]
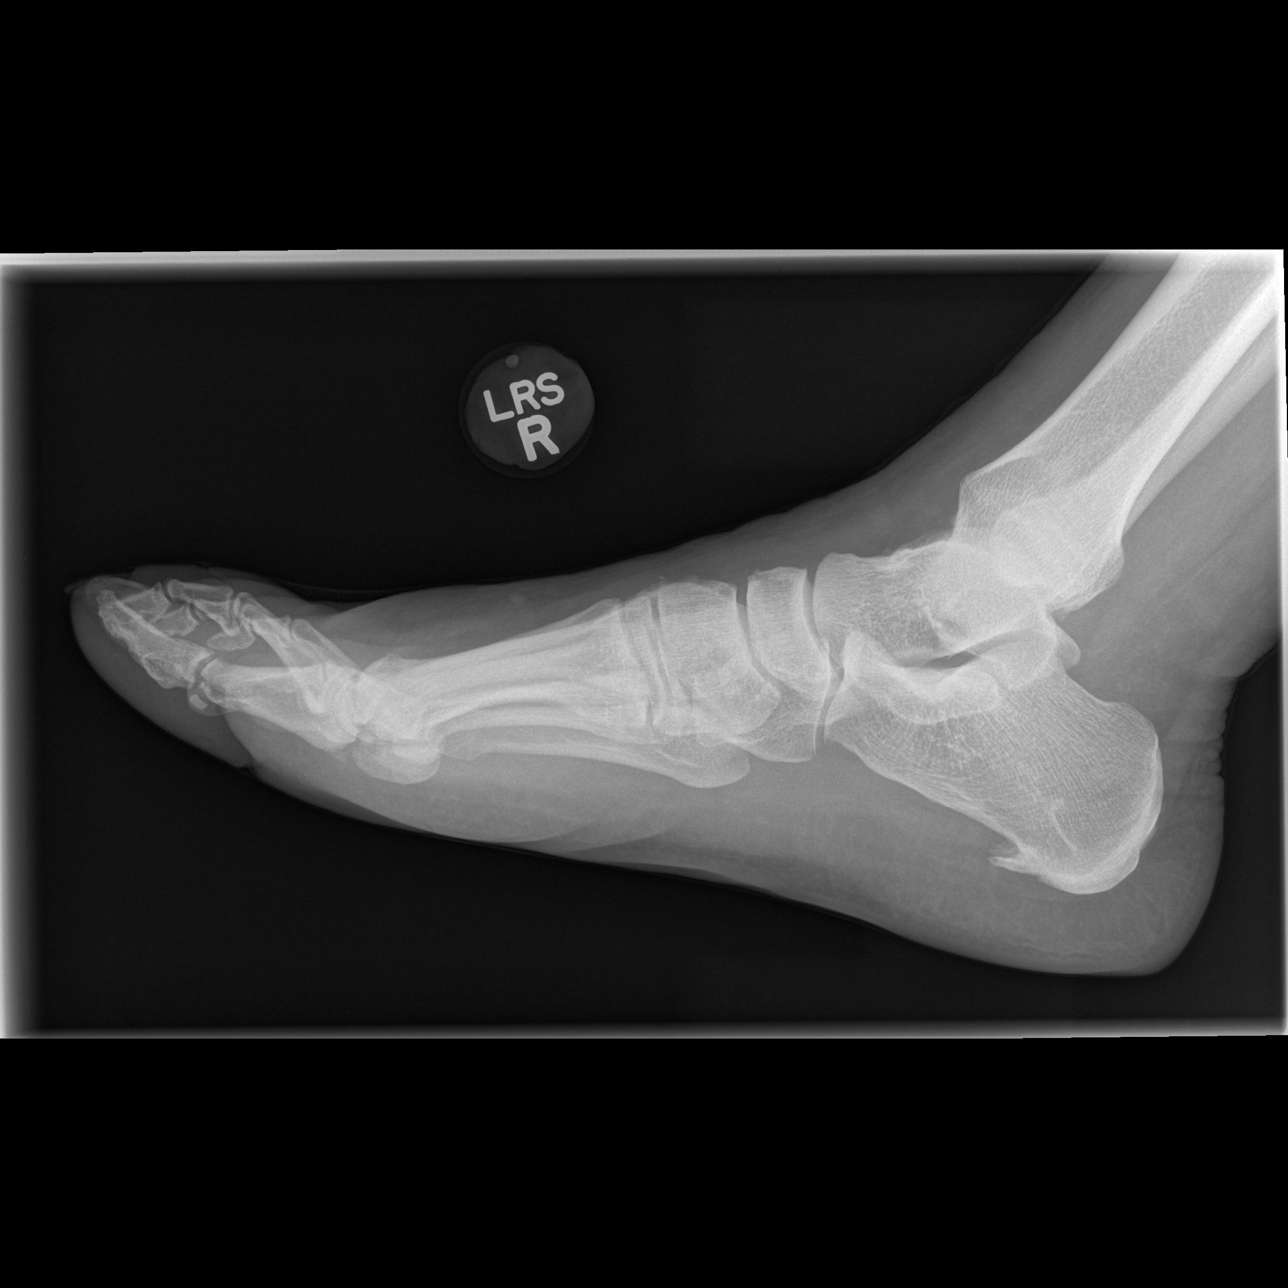

[3 of 3 positions shown; findings below may reference images not displayed]

FINDINGS: No displaced acute fracture or dislocation identified. No
aggressive appearing osseous lesion.  Mild degenerative changes of
the first interphalangeal joint. Plantar calcaneal enthesiophyte.
IMPRESSION: No acute osseous abnormality.

## 2013-03-21 IMAGING — CR DG CHEST 2V
1 series · 1 of 1 positions shown · non-contrast
Comparison: None.

CLINICAL DATA: Mid chest pain and shortness of breath, cough,
smoking history

CHEST - 2 VIEW

[w chest lat]
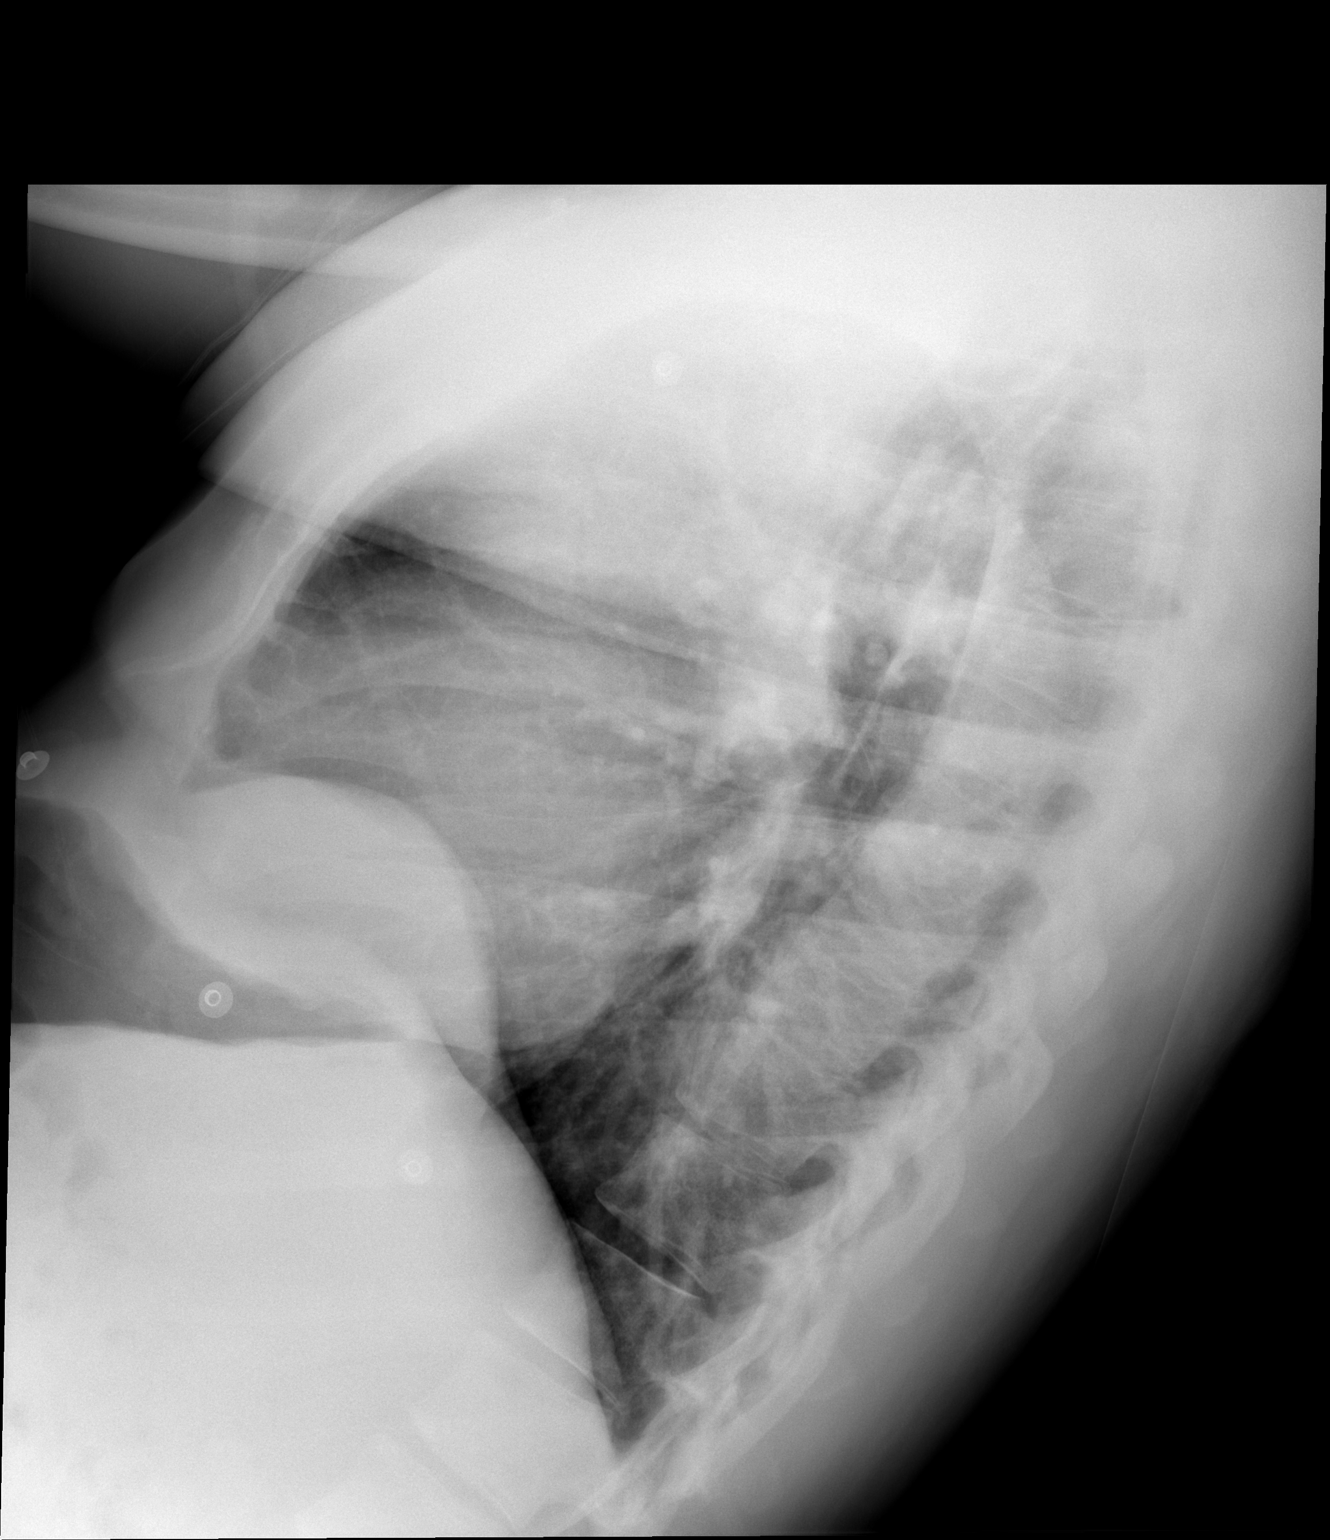

[1 of 1 positions shown; findings below may reference images not displayed]

FINDINGS: The lungs are clear.  There is mild cardiomegaly present.
No bony abnormality is seen.
IMPRESSION: No active lung disease.  Mild cardiomegaly.

## 2014-04-22 NOTE — H&P (Signed)
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 The patient is married for 6 years and lives with his wife who is 44 years old and is on disability for diabetic complications. The patient and his wife live with their children in a four bedroom house. The patient comes for his first inpatient hospitalization to psychiatry at [facility] with the chief complaint "I lost my son, he died three weeks ago, he drowned and he was 57 years old. I have been feeling very depressed and having suicidal thoughts and wishes."   HISTORY OF PRESENT ILLNESS: The patient reports that he was doing well two days prior to his son's death.  He took his son to his brother-in-law's house where they have an indoor swimming pool and he was swimming at night and he hit his head and his lungs collapsed and he was taken to the hospital but they could not revive him and he died. This has been hard on the patient and his four other children and his wife who started drinking alcohol since then. He has been feeling very low and down and depressed and hopeless and helpless and has suicidal thoughts and wishes.   PAST PSYCHIATRIC HISTORY: History of inpatient hospitalization to psychiatry in 1994-04-18 after the death of his mother. He was an inpatient for five days in [place], [place].  No history of suicide attempt, not being followed by any psychiatrist at this time.  FAMILY HISTORY OF MENTAL ILLNESS: Father has depression. No known history of suicides in the family.   FAMILY HISTORY: Raised by parents. Father is an Therapist, sports. Father is living and is 64 years old. Mother was a Therapist, sports. Mother died of heart disease in 70 at age 54 years. He has one sister, close to family.   PERSONAL HISTORY:  Born in [place], moved to [place] in 04-18-99 for his job. Has completed high school. Has four-year college degree from [place], [place] with major in community arts.   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 This job lasted for two or three years while in school. 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 Currently he is employed in advertising.   MILITARY HISTORY: None.  MARRIAGES: Married once, married for 30 years. Wife is on disability for diabetic complications. Has five children. Lost his 13 year old son and this has been hard on four of the children and his wife and him.  ALCOHOL AND DRUGS: First drink of alcohol "does not have problems with drinking alcohol". Denies street or prescription drug abuse. Denies smoking nicotine cigarettes.  MEDICAL HISTORY: Has hypertension and is on medication which is lisinopril 20 mg p.o. a.m. He is on Pravastatin for high cholesterol. No known history of diabetes mellitus, no major surgeries, no major injuries, no history of motor vehicle accident and has never been unconscious. He is allergic to aspirin. Being followed by Dr. Royal Hawthorn at [facility].  Last appointment was a few months ago and next appointment is to be made for annual physical. Had colonoscopy done and it was normal.   PHYSICAL EXAMINATION:  VITALS: Temperature 96.4, pulse 61  per minute and regular, respirations 18 per minute and regular, and blood pressure 130/90 mmHg.  HEENT: Head is normocephalic, atraumatic. Pupils are equally round and reactive to light and accommodation. Fundi bilaterally benign. Extraocular movements are intact visualized. Tympanic membranes with no exudate.  NECK: Supple without organomegaly, lymphadenopathy, or thyromegaly.  CHEST: Normal expansion, normal breath sounds heard.  HEART: Normal S1 and S2. No murmurs, rubs  or gallops.   ABDOMEN: Soft, no organomegaly. Bowel sounds heard.  NEURO: Gait is normal. Romberg is negative. Cranial nerves II through XII grossly intact. DTRs 2+.  Plantars have normal response.  MENTAL STATUS EXAMINATION: The patient is dressed in hospital pajamas, alert and oriented to place, person, and time and fully aware of the situation that brought him for admission to [facility]. Affect is appropriate with his mood which is low and down and depressed thinking about his son's death three weeks ago. Admits feeling hopeless and helpless, admits feeling worthless and useless. Does have suicidal thoughts and wishes but he wishes he can do something but currently he contracts for safety and is not going to hurt himself and is going to tell the staff. No evidence of psychosis. Denies auditory or visual hallucinations. He admits sometimes he has voices of his son talking to him. Memory is intact. General knowledge information is fair. Cognition is intact. Does admit to sleep and appetite disturbance and did not sleep at all for the past several days. Could spell the word world forward and backward, could count money, could do serial sevens. Insight and judgment are guarded.   IMPRESSION:  AXIS I: Adjustment disorder with depressed mood, depression secondary to son's death with suicidal ideas and wishes and contracts for safety.  AXIS II: Deferred.  AXIS III: High cholesterol and on medication for the same, hypertension and is on medication for the same.  AXIS IV: Severe - loss of his son three weeks ago to drowning at age 39 years.  AXIS V: 25.  PLAN: The patient is admitted to [facility] for closer observation, evaluation, management and help. He will be started on Pravastatin and lisinopril to help control his blood pressure. He will be started on Prozac 20 mg p.o. every a.m. to help him with his depression. This will be slowly increased to 40 mg and adjusted as needed so that his  depression will be under better control. He will be started on Trazodone 100 mg p.o. at bedtime as he reports in the past this dose has helped him to rest and has not been able to rest for the past several days. During the stay in the hospital, he will begin milieu therapy and supportive counseling and taking part in individual and group therapy where coping skills in dealing with the death will be discussed and grief counseling will be given. Social Services will contact his wife so that she can be a participant of grief counseling. At the time of discharge, appropriate followup appointments will be made and the patient and wife will be recommended along with the family to get help with grief counseling and coping skills in dealing with the death of their son. ____________________________ Wallace Cullens. Franchot Mimes, MD skc:slb D: 11/22/2011 18:50:20 ET T: 11/23/2011 08:49:01 ET JOB#: [id number]  cc: Arlyn Leak K. Franchot Mimes, MD, <Dictator> Dewain Penning MD ELECTRONICALLY SIGNED 11/23/2011 11:36

## 2015-01-16 ENCOUNTER — Emergency Department: Admit: 2015-01-17 | Payer: Self-pay

## 2015-01-16 DIAGNOSIS — I25110 Atherosclerotic heart disease of native coronary artery with unstable angina pectoris: Principal | ICD-10-CM

## 2015-01-16 NOTE — ED Notes (Signed)
Patient states no relief in pain after morphine.

## 2015-01-16 NOTE — ED Provider Notes (Signed)
HPI Comments: Patient is a 60 yo male with chest pain.  States pain is sharp, located in his left chest with radiation to his right arm.  States "last time I had this pain I had to have 2 stents placed".  Patient states under a lot of stress recently with wife passing away recently.  Denies any pain in his abdomen however states he does feel nauseated without vomiting.    Patient is a 60 y.o. male presenting with chest pain. The history is provided by the patient. No language interpreter was used.   Chest Pain (Angina)    Pertinent negatives include no abdominal pain, no back pain, no cough, no fever, no headaches, no nausea, no shortness of breath, no vomiting and no weakness.        Past Medical History:   Diagnosis Date   ??? HTN (hypertension)    ??? MI (myocardial infarction) ([place])        History reviewed. No pertinent past surgical history.      History reviewed. No pertinent family history.    Social History     Social History   ??? Marital status: SINGLE     Spouse name: N/A   ??? Number of children: N/A   ??? Years of education: N/A     Occupational History   ??? Not on file.     Social History Main Topics   ??? Smoking status: Not on file   ??? Smokeless tobacco: Not on file   ??? Alcohol use Not on file   ??? Drug use: Not on file   ??? Sexual activity: Not on file     Other Topics Concern   ??? Not on file     Social History Narrative   ??? No narrative on file         ALLERGIES: Aspirin    Review of Systems   Constitutional: Negative for chills and fever.   HENT: Negative for rhinorrhea and sore throat.    Eyes: Negative for visual disturbance.   Respiratory: Negative for cough and shortness of breath.    Cardiovascular: Positive for chest pain. Negative for leg swelling.   Gastrointestinal: Negative for abdominal pain, diarrhea, nausea and vomiting.   Genitourinary: Negative for dysuria.   Musculoskeletal: Negative for back pain and neck pain.   Skin: Negative for rash.   Neurological: Negative for weakness and headaches.    Psychiatric/Behavioral: The patient is not nervous/anxious.        Vitals:    01/16/15 2131 01/16/15 2132 01/16/15 2140   BP: 121/79 121/79 127/83   Pulse: 75 (!) 55 (!) 53   Resp: 16 16 12    Temp: 98.5 ??F (36.9 ??C)     SpO2: 100% 96% 93%   Weight: 90.7 kg (200 lb)     Height: 5' 11"  (1.803 m)              Physical Exam   Constitutional: He is oriented to person, place, and time. He appears well-developed and well-nourished.   HENT:   Head: Normocephalic.   Right Ear: External ear normal.   Left Ear: External ear normal.   Eyes: Conjunctivae and EOM are normal. Pupils are equal, round, and reactive to light.   Neck: Normal range of motion. Neck supple. No tracheal deviation present.   Cardiovascular: Regular rhythm, normal heart sounds and intact distal pulses.    No murmur heard.  Pulmonary/Chest: Effort normal and breath sounds normal. No respiratory distress.  Abdominal: Soft. There is no tenderness.   Musculoskeletal: Normal range of motion.   Neurological: He is alert and oriented to person, place, and time. No cranial nerve deficit.   Skin: No rash noted.   Nursing note and vitals reviewed.       MDM  Number of Diagnoses or Management Options     Amount and/or Complexity of Data Reviewed  Clinical lab tests: ordered and reviewed  Tests in the radiology section of CPT??: ordered and reviewed  Tests in the medicine section of CPT??: ordered and reviewed  Review and summarize past medical records: yes  Discuss the patient with other providers: yes (cardiology)    Risk of Complications, Morbidity, and/or Mortality  Presenting problems: high  Diagnostic procedures: high  Management options: high    Patient Progress  Patient progress: stable    ED Course       Procedures    I have reviewed all lab results which are normal or stable. Please inform the patient.  Xr Chest Port    Result Date: 01/16/2015  PORTABLE CHEST, January 16, 2015 at 2158 hours CLINICAL HISTORY:  Acute  chest pain. COMPARISON:  None. FINDINGS:  AP erect image demonstrates no confluent infiltrate or significant pleural fluid.  The heart size is within normal limits without evidence of congestive heart failure or pneumothorax.  The bony thorax appears intact on this view.  There are overlying radiopaque support devices.     IMPRESSION:  NO ACUTE CARDIOPULMONARY DISEASE IDENTIFIED.    Recent Results (from the past 12 hour(s))   POC TROPONIN-I    Collection Time: 01/16/15  9:28 PM   Result Value Ref Range    Troponin-I (POC) 0 0.0 - 0.08 ng/ml   CBC WITH AUTOMATED DIFF    Collection Time: 01/16/15  9:35 PM   Result Value Ref Range    WBC 6.5 4.3 - 11.1 K/uL    RBC 4.98 4.23 - 5.67 M/uL    HGB 13.2 (L) 13.6 - 17.2 g/dL    HCT 40.9 (L) 41.1 - 50.3 %    MCV 82.1 79.6 - 97.8 FL    MCH 26.5 26.1 - 32.9 PG    MCHC 32.3 31.4 - 35.0 g/dL    RDW 14.9 (H) 11.9 - 14.6 %    PLATELET 307 150 - 450 K/uL    MPV 9.7 (L) 10.8 - 14.1 FL    DF AUTOMATED      NEUTROPHILS 55 43 - 78 %    LYMPHOCYTES 34 13 - 44 %    MONOCYTES 7 4.0 - 12.0 %    EOSINOPHILS 4 0.5 - 7.8 %    BASOPHILS 0 0.0 - 2.0 %    IMMATURE GRANULOCYTES 0.0 0.0 - 5.0 %    ABS. NEUTROPHILS 3.6 1.7 - 8.2 K/UL    ABS. LYMPHOCYTES 2.2 0.5 - 4.6 K/UL    ABS. MONOCYTES 0.4 0.1 - 1.3 K/UL    ABS. EOSINOPHILS 0.2 0.0 - 0.8 K/UL    ABS. BASOPHILS 0.0 0.0 - 0.2 K/UL    ABS. IMM. GRANS. 0.0 0.0 - 0.5 K/UL   METABOLIC PANEL, COMPREHENSIVE    Collection Time: 01/16/15  9:35 PM   Result Value Ref Range    Sodium 141 136 - 145 mmol/L    Potassium 3.9 3.5 - 5.1 mmol/L    Chloride 104 98 - 107 mmol/L    CO2 30 21 - 32 mmol/L    Anion gap 7 7 - 16  mmol/L    Glucose 78 65 - 100 mg/dL    BUN 16 6 - 23 MG/DL    Creatinine 1.21 0.8 - 1.5 MG/DL    GFR est AA >60 >60 ml/min/1.5m    GFR est non-AA >60 >60 ml/min/1.732m   Calcium 8.7 8.3 - 10.4 MG/DL    Bilirubin, total 0.2 0.2 - 1.1 MG/DL    ALT 17 12 - 65 U/L    AST 14 (L) 15 - 37 U/L    Alk. phosphatase 41 (L) 50 - 136 U/L     Protein, total 7.0 6.3 - 8.2 g/dL    Albumin 3.5 3.5 - 5.0 g/dL    Globulin 3.5 2.3 - 3.5 g/dL    A-G Ratio 1.0 (L) 1.2 - 3.5     CK    Collection Time: 01/17/15 12:06 AM   Result Value Ref Range    CK 73 21 - 215 U/L   TROPONIN I    Collection Time: 01/17/15 12:06 AM   Result Value Ref Range    Troponin-I, Qt. <0.02 (L) 0.02 - 0.05 NG/ML         [age]o male with chest pain:     Patient discussed with cardiology for admission and further management.

## 2015-01-16 NOTE — ED Triage Notes (Signed)
Pt arrived via EMS c/o chest pain. That radiates to his right arm. PT was given 2 NTG with no relief. PT states his pain is a 10 out of 10. PT has a Hx of an MI 1 year ago.

## 2015-01-16 NOTE — H&P (Signed)
01/16/2015 10:44 PM    Admit Date: 01/16/2015    Admit Diagnosis: Coronary artery disease involving native coronary artery of native heart with unstable angina pectoris (HCC)      Subjective:    Patient presents with new onset sharp midsternal cp today. Worse with activity. Radiating down his arm. Started today. Present at rest but worse with activity. No nausea or diaphoresis. Similar to when he had heart attack 6 years ago in [place]. Two stents at that time. He claims asa allergy with hives.     Objective:      Visit Vitals   ??? BP 127/83   ??? Pulse (!) 53   ??? Temp 98.5 ??F (36.9 ??C)   ??? Resp 12   ??? Ht 5\' 11"  (1.803 m)   ??? Wt 90.7 kg (200 lb)   ??? SpO2 93%   ??? BMI 27.89 kg/m2       ROS:  General ROS: negative for - chills  Hematological and Lymphatic ROS: negative for - blood clots or jaundice  Respiratory ROS: no cough, shortness of breath, or wheezing  Cardiovascular ROS: no chest pain or dyspnea on exertion  Gastrointestinal ROS: no abdominal pain, change in bowel habits, or black or bloody stools  Neurological ROS: no TIA or stroke symptoms    Physical Exam:    Physical Examination: General appearance - alert, well appearing, and in no distress  Mental status - alert, oriented to person, place, and time  Eyes - pupils equal and reactive, extraocular eye movements intact  Neck/lymph - supple, no significant adenopathy  Chest/CV - clear to auscultation, no wheezes, rales or rhonchi, symmetric air entry  Heart - normal rate, regular rhythm, normal S1, S2, no murmurs, rubs, clicks or gallops  Abdomen/GI - soft, nontender, nondistended, no masses or organomegaly  Musculoskeletal - no joint tenderness, deformity or swelling  Extremities - peripheral pulses normal, no pedal edema, no clubbing or cyanosis  Skin - normal coloration and turgor, no rashes, no suspicious skin lesions noted    No current facility-administered medications for this encounter.      Current Outpatient Prescriptions   Medication Sig    ??? lisinopril (PRINIVIL, ZESTRIL) 40 mg tablet Take 40 mg by mouth daily.       Data Review:   @LABRCNT (Na,K,BUN,CREA,WBC,HGB,HCT,PLT,INR,TRP,TCHOL*,Triglyceride*,LDL*,LDLCPOC HDL*,HDL])@    TELEMETRY: nsr    Assessment/Plan:     Active Problems:    Coronary artery disease involving native coronary artery of native heart with unstable angina pectoris (HCC) (01/16/2015) no records here. Will admit and monitor. prob will end up with cardiac cath      Elevated lipids (01/16/2015) The current medical regimen is effective;  continue present plan and medications.        HTN (hypertension) (01/16/2015) The current medical regimen is effective;  continue present plan and medications.            Hilliard ClarkJon M Dru Laurel, MD

## 2015-01-16 NOTE — Progress Notes (Signed)
TRANSFER - IN REPORT:    Verbal report received from Joe, RN on Miguel CrandallDarius Ellis  being received from ED for routine progression of care      Report consisted of patient???s Situation, Background, Assessment and   Recommendations(SBAR).     Information from the following reports was received: Kardex, ED Summary, MAR and Recent Results.    Opportunity for questions and clarification was provided.      Patient received to room 327. Patient connected to monitor and assessment completed. Plan of care reviewed. Patient oriented to room and call light. Patient aware to use call light to communicate any chest pain or needs.

## 2015-01-17 ENCOUNTER — Inpatient Hospital Stay
Admit: 2015-01-17 | Discharge: 2015-01-20 | Disposition: A | Discharge status: Skilled Nursing Facility | Payer: Self-pay | Attending: Internal Medicine | Admitting: Internal Medicine

## 2015-01-17 LAB — EKG, 12 LEAD, SUBSEQUENT
Atrial Rate: 54 {beats}/min
Calculated P Axis: 36 degrees
Calculated R Axis: 0 degrees
Calculated T Axis: 21 degrees
P-R Interval: 182 ms
Q-T Interval: 442 ms
QRS Duration: 94 ms
QTC Calculation (Bezet): 419 ms
Ventricular Rate: 54 {beats}/min

## 2015-01-17 LAB — CBC WITH AUTOMATED DIFF
ABS. BASOPHILS: 0 10*3/uL (ref 0.0–0.2)
ABS. EOSINOPHILS: 0.2 10*3/uL (ref 0.0–0.8)
ABS. IMM. GRANS.: 0 10*3/uL (ref 0.0–0.5)
ABS. LYMPHOCYTES: 2.2 10*3/uL (ref 0.5–4.6)
ABS. MONOCYTES: 0.4 10*3/uL (ref 0.1–1.3)
ABS. NEUTROPHILS: 3.6 10*3/uL (ref 1.7–8.2)
BASOPHILS: 0 % (ref 0.0–2.0)
EOSINOPHILS: 4 % (ref 0.5–7.8)
HCT: 40.9 % — ABNORMAL LOW (ref 41.1–50.3)
HGB: 13.2 g/dL — ABNORMAL LOW (ref 13.6–17.2)
IMMATURE GRANULOCYTES: 0 % (ref 0.0–5.0)
LYMPHOCYTES: 34 % (ref 13–44)
MCH: 26.5 PG (ref 26.1–32.9)
MCHC: 32.3 g/dL (ref 31.4–35.0)
MCV: 82.1 FL (ref 79.6–97.8)
MONOCYTES: 7 % (ref 4.0–12.0)
MPV: 9.7 FL — ABNORMAL LOW (ref 10.8–14.1)
NEUTROPHILS: 55 % (ref 43–78)
PLATELET: 307 10*3/uL (ref 150–450)
RBC: 4.98 M/uL (ref 4.23–5.67)
RDW: 14.9 % — ABNORMAL HIGH (ref 11.9–14.6)
WBC: 6.5 10*3/uL (ref 4.3–11.1)

## 2015-01-17 LAB — LIPID PANEL
CHOL/HDL Ratio: 4.5
Cholesterol, total: 148 MG/DL (ref <200)
HDL Cholesterol: 33 MG/DL — ABNORMAL LOW (ref 40–60)
LDL, calculated: 96.8 MG/DL (ref <100)
Triglyceride: 91 MG/DL (ref 35–150)
VLDL, calculated: 18.2 MG/DL (ref 6.0–23.0)

## 2015-01-17 LAB — METABOLIC PANEL, COMPREHENSIVE
A-G Ratio: 1 — ABNORMAL LOW (ref 1.2–3.5)
ALT (SGPT): 17 U/L (ref 12–65)
AST (SGOT): 14 U/L — ABNORMAL LOW (ref 15–37)
Albumin: 3.5 g/dL (ref 3.5–5.0)
Alk. phosphatase: 41 U/L — ABNORMAL LOW (ref 50–136)
Anion gap: 7 mmol/L (ref 7–16)
BUN: 16 MG/DL (ref 6–23)
Bilirubin, total: 0.2 MG/DL (ref 0.2–1.1)
CO2: 30 mmol/L (ref 21–32)
Calcium: 8.7 MG/DL (ref 8.3–10.4)
Chloride: 104 mmol/L (ref 98–107)
Creatinine: 1.21 MG/DL (ref 0.8–1.5)
GFR est AA: >60 mL/min/{1.73_m2} (ref >60)
GFR est non-AA: >60 mL/min/{1.73_m2} (ref >60)
Globulin: 3.5 g/dL (ref 2.3–3.5)
Glucose: 78 mg/dL (ref 65–100)
Potassium: 3.9 mmol/L (ref 3.5–5.1)
Protein, total: 7 g/dL (ref 6.3–8.2)
Sodium: 141 mmol/L (ref 136–145)

## 2015-01-17 LAB — EKG, 12 LEAD, INITIAL
Atrial Rate: 55 {beats}/min
Calculated P Axis: 67 degrees
Calculated R Axis: 27 degrees
Calculated T Axis: 57 degrees
P-R Interval: 172 ms
Q-T Interval: 414 ms
QRS Duration: 88 ms
QTC Calculation (Bezet): 396 ms
Ventricular Rate: 55 {beats}/min

## 2015-01-17 LAB — CK: CK: 73 U/L (ref 21–215)

## 2015-01-17 LAB — POC TROPONIN: Troponin-I (POC): 0 ng/ml (ref 0.0–0.08)

## 2015-01-17 LAB — TROPONIN I: Troponin-I, Qt.: <0.02 NG/ML — ABNORMAL LOW (ref 0.02–0.05)

## 2015-01-17 MED ORDER — SODIUM CHLORIDE 0.9 % IJ SYRG
INTRAMUSCULAR | Status: DC | PRN
Start: 2015-01-17 — End: 2015-01-17

## 2015-01-17 MED ORDER — MORPHINE 4 MG/ML SYRINGE
4 mg/mL | INTRAMUSCULAR | Status: AC
Start: 2015-01-17 — End: 2015-01-16
  Administered 2015-01-17: 03:00:00 via INTRAVENOUS

## 2015-01-17 MED ORDER — ENOXAPARIN 40 MG/0.4 ML SUB-Q SYRINGE
40 mg/0.4 mL | SUBCUTANEOUS | Status: DC
Start: 2015-01-17 — End: 2015-01-20
  Administered 2015-01-17 – 2015-01-20 (×3): via SUBCUTANEOUS

## 2015-01-17 MED ORDER — SODIUM CHLORIDE 0.9 % IJ SYRG
Freq: Three times a day (TID) | INTRAMUSCULAR | Status: DC
Start: 2015-01-17 — End: 2015-01-17
  Administered 2015-01-17 (×3): via INTRAVENOUS

## 2015-01-17 MED ORDER — SODIUM CHLORIDE 0.9 % IJ SYRG
INTRAMUSCULAR | Status: DC | PRN
Start: 2015-01-17 — End: 2015-01-20
  Administered 2015-01-20: 03:00:00 via INTRAVENOUS

## 2015-01-17 MED ORDER — NITROGLYCERIN 0.2MG/ML SYRINGE
0.2 mg/mL | INTRAMUSCULAR | Status: AC
Start: 2015-01-17 — End: ?

## 2015-01-17 MED ORDER — IOVERSOL 350 MG/ML IV SOLN
350 mg iodine/mL | INTRAVENOUS | Status: AC
Start: 2015-01-17 — End: ?

## 2015-01-17 MED ORDER — IOPAMIDOL 76 % IV SOLN
370 mg iodine /mL (76 %) | Freq: Once | INTRAVENOUS | Status: AC
Start: 2015-01-17 — End: 2015-01-17
  Administered 2015-01-17: 14:00:00 via INTRAVENOUS

## 2015-01-17 MED ORDER — HEPARIN (PORCINE) IN NS (PF) 2,000 UNIT/1,000 ML IV
2000 unit/1,000 mL | INTRAVENOUS | Status: DC
Start: 2015-01-17 — End: 2015-01-17
  Administered 2015-01-17: 14:00:00 via INTRA_ARTERIAL

## 2015-01-17 MED ORDER — ATORVASTATIN 40 MG TAB
40 mg | Freq: Every day | ORAL | Status: DC
Start: 2015-01-17 — End: 2015-01-18
  Administered 2015-01-17 – 2015-01-18 (×2): via ORAL

## 2015-01-17 MED ORDER — VERAPAMIL 2.5 MG/ML IV
2.5 mg/mL | Freq: Once | INTRAVENOUS | Status: AC
Start: 2015-01-17 — End: 2015-01-17
  Administered 2015-01-17: 14:00:00 via INTRA_ARTERIAL

## 2015-01-17 MED ORDER — VERAPAMIL 2.5 MG/ML IV
2.5 mg/mL | INTRAVENOUS | Status: AC
Start: 2015-01-17 — End: ?

## 2015-01-17 MED ORDER — MIDAZOLAM 1 MG/ML IJ SOLN
1 mg/mL | INTRAMUSCULAR | Status: DC | PRN
Start: 2015-01-17 — End: 2015-01-17

## 2015-01-17 MED ORDER — MORPHINE 2 MG/ML INJECTION
2 mg/mL | INTRAMUSCULAR | Status: DC | PRN
Start: 2015-01-17 — End: 2015-01-17
  Administered 2015-01-17: 10:00:00 via INTRAVENOUS

## 2015-01-17 MED ORDER — IOVERSOL 350 MG/ML IV SOLN
350 mg iodine/mL | Freq: Once | INTRAVENOUS | Status: AC
Start: 2015-01-17 — End: 2015-01-17
  Administered 2015-01-17 (×2): via INTRA_ARTERIAL

## 2015-01-17 MED ORDER — NITROGLYCERIN 2 % TRANSDERMAL OINTMENT
2 % | Freq: Four times a day (QID) | TRANSDERMAL | Status: DC
Start: 2015-01-17 — End: 2015-01-17
  Administered 2015-01-17 (×2): via TOPICAL

## 2015-01-17 MED ORDER — LIDOCAINE HCL 2 % (20 MG/ML) IJ SOLN
20 mg/mL (2 %) | INTRAMUSCULAR | Status: DC | PRN
Start: 2015-01-17 — End: 2015-01-17
  Administered 2015-01-17: 14:00:00 via INTRADERMAL

## 2015-01-17 MED ORDER — FLUOXETINE 20 MG CAP
20 mg | Freq: Every day | ORAL | Status: DC
Start: 2015-01-17 — End: 2015-01-20
  Administered 2015-01-17 – 2015-01-20 (×4): via ORAL

## 2015-01-17 MED ORDER — MIDAZOLAM 1 MG/ML IJ SOLN
1 mg/mL | INTRAMUSCULAR | Status: AC
Start: 2015-01-17 — End: ?

## 2015-01-17 MED ORDER — FENTANYL CITRATE (PF) 50 MCG/ML IJ SOLN
50 mcg/mL | INTRAMUSCULAR | Status: DC | PRN
Start: 2015-01-17 — End: 2015-01-17

## 2015-01-17 MED ORDER — METOPROLOL TARTRATE 50 MG TAB
50 mg | Freq: Two times a day (BID) | ORAL | Status: DC
Start: 2015-01-17 — End: 2015-01-18
  Administered 2015-01-17 – 2015-01-18 (×3): via ORAL

## 2015-01-17 MED ORDER — IOPAMIDOL 76 % IV SOLN
370 mg iodine /mL (76 %) | Freq: Once | INTRAVENOUS | Status: DC
Start: 2015-01-17 — End: 2015-01-17

## 2015-01-17 MED ORDER — SODIUM CHLORIDE 0.9 % IJ SYRG
Freq: Three times a day (TID) | INTRAMUSCULAR | Status: DC
Start: 2015-01-17 — End: 2015-01-18
  Administered 2015-01-17 – 2015-01-18 (×5): via INTRAVENOUS

## 2015-01-17 MED ORDER — LISINOPRIL 20 MG TAB
20 mg | Freq: Every day | ORAL | Status: DC
Start: 2015-01-17 — End: 2015-01-18
  Administered 2015-01-17 – 2015-01-18 (×2): via ORAL

## 2015-01-17 MED ORDER — HEPARIN (PORCINE) IN NS (PF) 2,000 UNIT/1,000 ML IV
2000 unit/1,000 mL | INTRAVENOUS | Status: AC
Start: 2015-01-17 — End: ?

## 2015-01-17 MED ORDER — NITROGLYCERIN 0.4 MG SUBLINGUAL TAB
0.4 mg | SUBLINGUAL | Status: DC | PRN
Start: 2015-01-17 — End: 2015-01-20
  Administered 2015-01-17: 05:00:00 via SUBLINGUAL

## 2015-01-17 MED ORDER — FENTANYL CITRATE (PF) 50 MCG/ML IJ SOLN
50 mcg/mL | INTRAMUSCULAR | Status: AC
Start: 2015-01-17 — End: ?

## 2015-01-17 MED ORDER — LIDOCAINE HCL 2 % (20 MG/ML) IJ SOLN
20 mg/mL (2 %) | INTRAMUSCULAR | Status: AC
Start: 2015-01-17 — End: ?

## 2015-01-17 MED ORDER — HEPARIN (PORCINE) 10,000 UNIT/ML IJ SOLN
10000 unit/mL | INTRAMUSCULAR | Status: AC
Start: 2015-01-17 — End: ?

## 2015-01-17 MED ORDER — SODIUM CHLORIDE 0.9 % IV
INTRAVENOUS | Status: DC
Start: 2015-01-17 — End: 2015-01-18

## 2015-01-17 MED FILL — NITRO-BID 2 % TRANSDERMAL OINTMENT: 2 % | TRANSDERMAL | Qty: 1

## 2015-01-17 MED FILL — ATORVASTATIN 40 MG TAB: 40 mg | ORAL | Qty: 1

## 2015-01-17 MED FILL — NITROSTAT 0.4 MG SUBLINGUAL TABLET: 0.4 mg | SUBLINGUAL | Qty: 1

## 2015-01-17 MED FILL — LISINOPRIL 20 MG TAB: 20 mg | ORAL | Qty: 2

## 2015-01-17 MED FILL — FENTANYL CITRATE (PF) 50 MCG/ML IJ SOLN: 50 mcg/mL | INTRAMUSCULAR | Qty: 2

## 2015-01-17 MED FILL — XYLOCAINE 20 MG/ML (2 %) INJECTION SOLUTION: 20 mg/mL (2 %) | INTRAMUSCULAR | Qty: 20

## 2015-01-17 MED FILL — MIDAZOLAM 1 MG/ML IJ SOLN: 1 mg/mL | INTRAMUSCULAR | Qty: 2

## 2015-01-17 MED FILL — VERAPAMIL 2.5 MG/ML IV: 2.5 mg/mL | INTRAVENOUS | Qty: 2

## 2015-01-17 MED FILL — LOVENOX 40 MG/0.4 ML SUBCUTANEOUS SYRINGE: 40 mg/0.4 mL | SUBCUTANEOUS | Qty: 0.4

## 2015-01-17 MED FILL — HEPARIN (PORCINE) 10,000 UNIT/ML IJ SOLN: 10000 unit/mL | INTRAMUSCULAR | Qty: 1

## 2015-01-17 MED FILL — MORPHINE 4 MG/ML SYRINGE: 4 mg/mL | INTRAMUSCULAR | Qty: 1

## 2015-01-17 MED FILL — HEPARIN (PORCINE) IN NS (PF) 2,000 UNIT/1,000 ML IV: 2000 unit/1,000 mL | INTRAVENOUS | Qty: 1000

## 2015-01-17 MED FILL — NITROGLYCERIN 0.2MG/ML SYRINGE: 0.2 mg/mL | INTRAMUSCULAR | Qty: 2

## 2015-01-17 MED FILL — SODIUM CHLORIDE 0.9 % IV: INTRAVENOUS | Qty: 1000

## 2015-01-17 MED FILL — OPTIRAY 350 MG IODINE/ML INTRAVENOUS SOLUTION: 350 mg iodine/mL | INTRAVENOUS | Qty: 300

## 2015-01-17 MED FILL — FLUOXETINE 20 MG CAP: 20 mg | ORAL | Qty: 2

## 2015-01-17 MED FILL — MORPHINE 2 MG/ML INJECTION: 2 mg/mL | INTRAMUSCULAR | Qty: 1

## 2015-01-17 MED FILL — METOPROLOL TARTRATE 50 MG TAB: 50 mg | ORAL | Qty: 1

## 2015-01-17 NOTE — Progress Notes (Signed)
Problem: Cath Lab Procedures: Post-Cath Day of Procedure (Initiate SCIP Measures for Post-Op Care)  Goal: Discharge Planning  Outcome: Resolved/Met Date Met:  01/17/15  Pt cleared medically.   Goal: Psychosocial  Outcome: Not Met  Pt awaiting tele-psych consult.

## 2015-01-17 NOTE — Progress Notes (Signed)
Spoke with pt who stated that he has been in town for about two weeks because he came here because his wife got a job in [place].  He states that she died on December 31st.  When asked about where he worked he stated that he worked out of town.  When questioned he stated that he worked in [place].  Pt is calm and in bed ant on his way to the cath lab. He is on suicide watch with a sitter at bedside while in the unit.  Cath lab has taken pt for his cath.

## 2015-01-17 NOTE — Progress Notes (Signed)
Bedside and Verbal shift change received [place]. Report included the following information SBAR, Kardex, ED Summary, Procedure Summary, Intake/Output, MAR, Recent Results and Cardiac Rhythm SB.

## 2015-01-17 NOTE — Progress Notes (Signed)
Spoke with Dr. Chipper HerbZhang who stated that he would send evaluation through fax.  Confirmed fax number.  Returned Tele-Psych monitor to ED.  Pt is calm in bed with sitter in place

## 2015-01-17 NOTE — Progress Notes (Signed)
Verbal bedside report given to Standley DakinsAmy Wilson, oncoming RN. Patient's situation, background, assessment and recommendations provided. Opportunity for questions provided. Oncoming RN assumed care of patient. Right radial site visualized.

## 2015-01-17 NOTE — Progress Notes (Signed)
01/17/2015 7:52 AM    Admit Date: 01/16/2015    Admit Diagnosis: Coronary artery disease involving native coronary artery of*      Subjective:    Patient with cp that is low risk. He will end up with cath today but needs to go to [place] for hearing voices and suicidal thoughts.    Objective:      Visit Vitals   ??? BP 104/62 (BP 1 Location: Left arm, BP Patient Position: At rest)   ??? Pulse 60   ??? Temp 97.4 ??F (36.3 ??C)   ??? Resp 16   ??? Ht 5\' 11"  (1.803 m)   ??? Wt 92.1 kg (203 lb)   ??? SpO2 99%   ??? BMI 28.31 kg/m2       ROS:  General ROS: negative for - chills  Hematological and Lymphatic ROS: negative for - blood clots or jaundice  Respiratory ROS: no cough, shortness of breath, or wheezing  Cardiovascular ROS: no chest pain or dyspnea on exertion  Gastrointestinal ROS: no abdominal pain, change in bowel habits, or black or bloody stools  Neurological ROS: no TIA or stroke symptoms    Physical Exam:    Physical Examination: General appearance - alert, well appearing, and in no distress  Mental status - alert, oriented to person, place, and time  Eyes - pupils equal and reactive, extraocular eye movements intact  Neck/lymph - supple, no significant adenopathy  Chest/CV - clear to auscultation, no wheezes, rales or rhonchi, symmetric air entry  Heart - normal rate, regular rhythm, normal S1, S2, no murmurs, rubs, clicks or gallops  Abdomen/GI - soft, nontender, nondistended, no masses or organomegaly  Musculoskeletal - no joint tenderness, deformity or swelling  Extremities - peripheral pulses normal, no pedal edema, no clubbing or cyanosis  Skin - normal coloration and turgor, no rashes, no suspicious skin lesions noted    Current Facility-Administered Medications   Medication Dose Route Frequency   ??? FLUoxetine (PROzac) capsule 40 mg  40 mg Oral DAILY   ??? lisinopril (PRINIVIL, ZESTRIL) tablet 40 mg  40 mg Oral DAILY   ??? sodium chloride (NS) flush 5-10 mL  5-10 mL IntraVENous Q8H    ??? sodium chloride (NS) flush 5-10 mL  5-10 mL IntraVENous PRN   ??? nitroglycerin (NITROBID) 2 % ointment 1 Inch  1 Inch Topical Q6H   ??? nitroglycerin (NITROSTAT) tablet 0.4 mg  0.4 mg SubLINGual Q5MIN PRN   ??? enoxaparin (LOVENOX) injection 40 mg  40 mg SubCUTAneous Q24H   ??? metoprolol tartrate (LOPRESSOR) tablet 50 mg  50 mg Oral BID   ??? atorvastatin (LIPITOR) tablet 40 mg  40 mg Oral DAILY       Data Review:   @LABRCNT (Na,K,BUN,CREA,WBC,HGB,HCT,PLT,INR,TRP,TCHOL*,Triglyceride*,LDL*,LDLCPOC HDL*,HDL])@    TELEMETRY: nsr    Assessment/Plan:     Active Problems:    Coronary artery disease involving native coronary artery of native heart with unstable angina pectoris (HCC) (01/16/2015)The current medical regimen is effective;  continue present plan and medications.         Elevated lipids (01/16/2015)The current medical regimen is effective;  continue present plan and medications.        HTN (hypertension) (01/16/2015) The current medical regimen is effective;  continue present plan and medications.            Hilliard ClarkJon M Breckin Savannah, MD

## 2015-01-17 NOTE — Progress Notes (Deleted)
Notified Dr. Haynes HoehnMathias of increasing troponin of 2.95. This is an increase from 0.56 at 1400 earlier today. No orders received at this time. Will continue to monitor troponins every 6 hours. Patient is not having symptoms of chest pain or dyspnea.

## 2015-01-17 NOTE — Consults (Signed)
Chief Complaint:  Chest pain     We are asked to see this patient regarding depression and suicidal thoughts.     Patient Active Problem List    Diagnosis Date Noted   ??? Coronary artery disease involving native coronary artery of native heart with unstable angina pectoris ([place]) 01/16/2015   ??? Elevated lipids 01/16/2015   ??? HTN (hypertension) 01/16/2015       History of Present Illness:    Patient is a 60 y.o. African American male who presented to ER 1/13 pm with complaints of new onset 10/10 sharp chest pain radiating from left chest to his right arm with no relief from 2 NTG en route.  Worse with activity.  He has history of MI with 2 stents six years ago and unstable angina.  Stated current pain resembles pain noted with MI.  Nausea without vomiting, no shortness of breath, diaphoresis, vomiting, weakness. He got no relief with morphine, was administered oxygen and nitropaste.   Reports extreme stress due to recent passing of wife.  Additional history includes hypertension, hyperlipidemia.  Vitals were stable on presentation, all labs are normal.  He was admitted by Dr Octavio Manns, Cardiology service.  At this am, nurse's notes report patient admitting to depression for the past year, escalating to thoughts of suicide when his wife of 62 years died 17-Jan-2015 from a "silent heart attack",  and intermittent auditory hallucinations yesterday after he heard a male voice telling him to kill himself.  He also reports his daughter died this past 25-Nov-2022 from Leukemia. His only plan was loosely to OD on benadryl.  At that point, he was assigned a Actuary.  Apparently, he and his wife moved here from [place] just prior to her death due to her being offered a job in [place].  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  He is originally from [place]. He was taken to Cath lab this am with mild findings as noted by Cardiology in their note.  At 1800 today,  patient spoke with Telepsych Dr Roosevelt Locks, with both parties agreeing he needs inpatient psych care.  He does have a long history of depression, has been inpatient x 3 for same.  Attempted suicide with overdose when his daughter died six months ago.  He has been taking prozac, now up to 40 mg daily for the past 17 years, feels it may have lost its effectiveness.  He also has a strong family history of depression with father suffering major episodes, and a cousin who committed suicide.  He is found sitting up in bed, watching TV and intermittently chatting with sitter.  He is well groomed, very articulate, and makes good eye contact.  Again, agrees to inpatient psych care, and responds well to interaction.  Asking about psych resources in the area, and volunteer organizations to fill his time.  He reports being a participating member of the [employer].  His affect is very sad.      PAST MEDICAL HISTORY:  Past Medical History   Diagnosis Date   ??? HTN (hypertension)    ??? MI (myocardial infarction) ([place])     Hyperlipidemia    PAST SURGICAL HISTORY:  History reviewed. No pertinent past surgical history.     PTA MEDICATIONS:  Prior to Admission medications    Medication Sig Start Date End Date Taking? Authorizing Provider   FLUoxetine (PROZAC) 40 mg capsule Take 40 mg by mouth daily. Indications: DEPRESSION ASSOCIATED WITH BIPOLAR DISORDER,  ADJUNCT   Yes Historical Provider   lisinopril (PRINIVIL, ZESTRIL) 40 mg tablet Take 40 mg by mouth daily.   Yes Phys Other, MD         CURRENT INPATIENT MEDICATIONS:  Current Facility-Administered Medications   Medication Dose Route Frequency   ??? FLUoxetine (PROzac) capsule 40 mg  40 mg Oral DAILY   ??? heparin (PF) 2 units/ml in NS infusion  3 mL/hr IntraarTERial CONTINUOUS   ??? midazolam (VERSED) injection 0.5-5 mg  0.5-5 mg IntraVENous Multiple   ??? fentaNYL citrate (PF) injection 100 mcg  100 mcg IntraVENous PRN   ??? lidocaine (XYLOCAINE) 20 mg/mL (2 %) injection 20-400 mg  1-20 mL  IntraDERMal Multiple   ??? iopamidol (ISOVUE-370) 76 % injection 10-300 mL  10-300 mL IntraVENous RAD ONCE   ??? 0.9% sodium chloride infusion  75 mL/hr IntraVENous CONTINUOUS   ??? sodium chloride (NS) flush 5-10 mL  5-10 mL IntraVENous Q8H   ??? sodium chloride (NS) flush 5-10 mL  5-10 mL IntraVENous PRN   ??? lisinopril (PRINIVIL, ZESTRIL) tablet 40 mg  40 mg Oral DAILY   ??? sodium chloride (NS) flush 5-10 mL  5-10 mL IntraVENous Q8H   ??? sodium chloride (NS) flush 5-10 mL  5-10 mL IntraVENous PRN   ??? nitroglycerin (NITROSTAT) tablet 0.4 mg  0.4 mg SubLINGual Q5MIN PRN   ??? enoxaparin (LOVENOX) injection 40 mg  40 mg SubCUTAneous Q24H   ??? metoprolol tartrate (LOPRESSOR) tablet 50 mg  50 mg Oral BID   ??? atorvastatin (LIPITOR) tablet 40 mg  40 mg Oral DAILY       ALLERGIES:  Allergies   Allergen Reactions   ??? Aspirin Hives      FAMILY HISTORY:   Father with major depression   Cousin committed suicide.     SOCIAL HISTORY:  Social History   Substance Use Topics   ??? Smoking status: denies   ??? Smokeless tobacco: Denies   ??? Alcohol use Denies      History   Drug Use Not on file        SOCIAL HISTORY:  Patient was married for 33 years, wife died of MI unexpectedly 2015-01-06 shortly after they relocated here for her job from [place].  Patient grew up in [place], and graduated from [employer] with degree in Radio and [employer].  His only daughter died in 11/06/2022 last year of leukemia.     Review of Systems  ROS were reviewed, and all are negative outside the HPI.    Physical Examination:   Patient Vitals for the past 24 hrs:   BP Temp Pulse Resp SpO2 Height Weight   01/17/15 1625 98/67 98.4 ??F (36.9 ??C) (!) 59 16 98 % - -   01/17/15 1322 97/72 99.1 ??F (37.3 ??C) (!) 47 17 98 % - -   01/17/15 1200 - - (!) 48 - - - -   01/17/15 0930 112/75 97.7 ??F (36.5 ??C) (!) 54 16 97 % - -   01/17/15 0858 101/65 - 60 16 98 % - -   01/17/15 0736 106/66 97.6 ??F (36.4 ??C) (!) 54 18 98 % - -    01/17/15 0511 104/62 97.4 ??F (36.3 ??C) 60 16 99 % - 92.1 kg (203 lb)   01/17/15 0055 105/71 98.1 ??F (36.7 ??C) (!) 51 18 100 % - -   01/17/15 0010 123/85 - - - - - -   01/17/15 0005 129/88 - (!) 50 - - - -  01/16/15 2332 124/83 97.5 ??F (36.4 ??C) (!) 48 20 100 % - -   01/16/15 2300 106/71 - (!) 55 24 96 % - -   01/16/15 2240 120/78 - (!) 55 21 98 % - -   01/16/15 2220 118/81 - (!) 54 26 96 % - -   01/16/15 2200 116/76 - (!) 57 - - - -   01/16/15 2140 127/83 - (!) 53 12 93 % - -   01/16/15 2132 121/79 - (!) 55 16 96 % - -   01/16/15 2131 121/79 98.5 ??F (36.9 ??C) 75 16 100 % 5' 11"  (1.803 m) 90.7 kg (200 lb)     Height: 5' 11"  (180.3 cm)  Weight: 92.1 kg (203 lb)  BMI (calculated): 28     01/13 0701 - 01/14 1900  In: 240 [P.O.:240]  Out: 100 [Urine:100]    General appearance: Well groomed, well nourished, well hydrated.  Sad affect.  Articulate, polite, and conversant when spoken to.  Does not seem unusually distracted.  Able to laugh appropriately.  Oriented to time, place and person.  Cooperative, no physical distress, appears younger than stated age.    Head: Normocephalic, without obvious abnormality, atraumatic  Neck: supple, symmetrical, trachea midline, no JVD  Lungs: clear to auscultation bilaterally  Heart: regular rate and rhythm, S1, S2 normal, no murmur, click, rub or gallop  Abdomen: soft, non-tender. Bowel sounds normal. No masses,  no organomegaly  Extremities: extremities normal, atraumatic, no cyanosis or edema  Skin: Skin color, texture, turgor normal. No rashes or lesions  Neurologic: Grossly normal    Labs:  Recent Results (from the past 24 hour(s))   EKG, 12 LEAD, INITIAL    Collection Time: 01/16/15  9:21 PM   Result Value Ref Range    Systolic BP  mmHg    Diastolic BP  mmHg    Ventricular Rate 55 BPM    Atrial Rate 55 BPM    P-R Interval 172 ms    QRS Duration 88 ms    Q-T Interval 414 ms    QTC Calculation (Bezet) 396 ms    Calculated P Axis 67 degrees    Calculated R Axis 27 degrees     Calculated T Axis 57 degrees    Diagnosis       Sinus bradycardia  Otherwise normal ECG  Confirmed by Driscilla Grammes ([id number]) on 01/17/2015 12:28:02 PM     POC TROPONIN-I    Collection Time: 01/16/15  9:28 PM   Result Value Ref Range    Troponin-I (POC) 0 0.0 - 0.08 ng/ml   CBC WITH AUTOMATED DIFF    Collection Time: 01/16/15  9:35 PM   Result Value Ref Range    WBC 6.5 4.3 - 11.1 K/uL    RBC 4.98 4.23 - 5.67 M/uL    HGB 13.2 (L) 13.6 - 17.2 g/dL    HCT 40.9 (L) 41.1 - 50.3 %    MCV 82.1 79.6 - 97.8 FL    MCH 26.5 26.1 - 32.9 PG    MCHC 32.3 31.4 - 35.0 g/dL    RDW 14.9 (H) 11.9 - 14.6 %    PLATELET 307 150 - 450 K/uL    MPV 9.7 (L) 10.8 - 14.1 FL    DF AUTOMATED      NEUTROPHILS 55 43 - 78 %    LYMPHOCYTES 34 13 - 44 %    MONOCYTES 7 4.0 - 12.0 %    EOSINOPHILS 4 0.5 -  7.8 %    BASOPHILS 0 0.0 - 2.0 %    IMMATURE GRANULOCYTES 0.0 0.0 - 5.0 %    ABS. NEUTROPHILS 3.6 1.7 - 8.2 K/UL    ABS. LYMPHOCYTES 2.2 0.5 - 4.6 K/UL    ABS. MONOCYTES 0.4 0.1 - 1.3 K/UL    ABS. EOSINOPHILS 0.2 0.0 - 0.8 K/UL    ABS. BASOPHILS 0.0 0.0 - 0.2 K/UL    ABS. IMM. GRANS. 0.0 0.0 - 0.5 K/UL   METABOLIC PANEL, COMPREHENSIVE    Collection Time: 01/16/15  9:35 PM   Result Value Ref Range    Sodium 141 136 - 145 mmol/L    Potassium 3.9 3.5 - 5.1 mmol/L    Chloride 104 98 - 107 mmol/L    CO2 30 21 - 32 mmol/L    Anion gap 7 7 - 16 mmol/L    Glucose 78 65 - 100 mg/dL    BUN 16 6 - 23 MG/DL    Creatinine 1.21 0.8 - 1.5 MG/DL    GFR est AA >60 >60 ml/min/1.82m    GFR est non-AA >60 >60 ml/min/1.746m   Calcium 8.7 8.3 - 10.4 MG/DL    Bilirubin, total 0.2 0.2 - 1.1 MG/DL    ALT 17 12 - 65 U/L    AST 14 (L) 15 - 37 U/L    Alk. phosphatase 41 (L) 50 - 136 U/L    Protein, total 7.0 6.3 - 8.2 g/dL    Albumin 3.5 3.5 - 5.0 g/dL    Globulin 3.5 2.3 - 3.5 g/dL    A-G Ratio 1.0 (L) 1.2 - 3.5     CK    Collection Time: 01/17/15 12:06 AM   Result Value Ref Range    CK 73 21 - 215 U/L   TROPONIN I    Collection Time: 01/17/15 12:06 AM    Result Value Ref Range    Troponin-I, Qt. <0.02 (L) 0.02 - 0.05 NG/ML   EKG, 12 LEAD, SUBSEQUENT    Collection Time: 01/17/15 12:31 AM   Result Value Ref Range    Systolic BP  mmHg    Diastolic BP  mmHg    Ventricular Rate 54 BPM    Atrial Rate 54 BPM    P-R Interval 182 ms    QRS Duration 94 ms    Q-T Interval 442 ms    QTC Calculation (Bezet) 419 ms    Calculated P Axis 36 degrees    Calculated R Axis 0 degrees    Calculated T Axis 21 degrees    Diagnosis       Sinus bradycardia  Possible Left atrial enlargement  Borderline ECG  Confirmed by MEDriscilla [id number]) on 01/17/2015 12:28:07 PM     LIPID PANEL    Collection Time: 01/17/15  5:06 AM   Result Value Ref Range    LIPID PROFILE          Cholesterol, total 148 <200 MG/DL    Triglyceride 91 35 - 150 MG/DL    HDL Cholesterol 33 (L) 40 - 60 MG/DL    LDL, calculated 96.8 <100 MG/DL    VLDL, calculated 18.2 6.0 - 23.0 MG/DL    CHOL/HDL Ratio 4.5         CXR:  AP erect image demonstrates no confluent infiltrate or significant pleural fluid. The heart size is within normal limits without evidence of congestive heart failure or pneumothorax. The bony thorax appears intact on this view. There are overlying  radiopaque support devices. ??  IMPRESSION: NO ACUTE CARDIOPULMONARY DISEASE IDENTIFIED.    Category Status:  1    Assessment and Plan:   Coronary artery disease involving native coronary artery   Unstable angina  Elevated lipids   HTN   History of major depression and prior psych inpatient care x 3  Previous suicide attempt with OD after daughter died  Suicidal thoughts with loose plan  Major familial losses and life stressors in the past six months.     PLAN;  Begin seroquel 100 mg q HS per recommendation of Dr Roosevelt Locks  Continue prozac at current dose  Inpatient psych on discharge, hopefully [facility]  Consult case management and social work for arrangements  Thank you for involving [place] in the care of this pleasant patient. Will follow.

## 2015-01-17 NOTE — Progress Notes (Signed)
Verbal bedside report given to oncoming RN, Jennifer Thompson. Patient's situation, background, assessment and recommendations provided. Opportunity for questions provided. Oncoming RN assumed care of patient.

## 2015-01-17 NOTE — Progress Notes (Signed)
TRANSFER - IN REPORT:    Verbal report received from [place] , RN on Miguel Ellis  being received from CCL for routine progression of care.    Report consisted of patient???s Situation, Background, Assessment and   Recommendations(SBAR).     Information from the following reports was reviewed: Kardex, Procedure Summary, MAR and Recent Results.    Opportunity for questions and clarification was provided.      Assessment completed upon patient???s arrival to unit and care assumed.     Patient received to room 327  and assessment completed. Patient connected to telemetry monitor and eagle with BP cycling every 15 minutes. Patient oriented to room and plan of care reviewed. Patient voiced understanding of keeping wrist immobilized. right radial site benign, dressing dry and intact, no hematoma; R band in place. Patient provided with clear liquids. Patient aware to use call light to communicate needs. Instructed patient to not use arm for any pushing or pulling.    Admission skin assessment completed with second RN and reveals the following: right radial band in place.  CDI.

## 2015-01-17 NOTE — Progress Notes (Signed)
TRANSFER - OUT REPORT:    Verbal report given to  3rd Floor Tele RN(name) on Miguel Ellis  being transferred to 3rd Floor Tele(unit) for routine progression of care       Report consisted of patient???s Situation, Background, Assessment and   Recommendations(SBAR).     Information from the following report(s) SBAR and Procedure Summary was reviewed with the receiving nurse.    Opportunity for questions and clarification was provided.      Procedure: LHC   Finding Summary: Diagnostic Only(cath/pci/pacer settings)  Location: R Wrist    Closure Device: R Band 8 ml of air(yes/no/description)  Post Site Assessment: no oozing or hematoma     Intra Procedure Meds:    Versed: na  Fentanyl: na  Heparin: 2000 units  Angiomax Stop Time: na  Reopro:  na  Integrelin: na  Antiplatelet: na             Peripheral IV 01/16/15 Left Antecubital (Active)   Site Assessment Clean, dry, & intact 01/17/2015  8:00 AM   Phlebitis Assessment 0 01/17/2015  8:00 AM   Infiltration Assessment 0 01/17/2015  8:00 AM   Dressing Status Clean, dry, & intact 01/17/2015  8:00 AM   Dressing Type Tape;Transparent 01/17/2015  8:00 AM   Hub Color/Line Status Capped 01/17/2015  8:00 AM        Post-Procedure Site Assessment (1)  Wound Type: Catheter entry/exit  Location: Wrist  Orientation : Right  Hemostasis : TR Band (8 ml in the band)  Site Assessment: No bleeding, No hematoma                       is allergic to aspirin.    Past Medical History   Diagnosis Date   ??? HTN (hypertension)    ??? MI (myocardial infarction) ([facility])      Visit Vitals   ??? BP 101/65   ??? Pulse 60   ??? Temp 97.6 ??F (36.4 ??C)   ??? Resp 16   ??? Ht 5\' 11"  (1.803 m)   ??? Wt 92.1 kg (203 lb)   ??? SpO2 98%   ??? BMI 28.31 kg/m2

## 2015-01-17 NOTE — Progress Notes (Signed)
0005: Patient describes chest pain 10/10 and as a numbness in his chest and pain that radiates down right arm . 2 L, oxygen placed on patient, nitro paste placed on patient EKG ordered, BP 129/88, HR 50. One nitro tab given.    0010: 1 nitro tab given chest pain 9/10, BP 123/85, HR 54    0015: BP 113/71 HR 55 chest pain 9/10

## 2015-01-17 NOTE — Progress Notes (Signed)
Problem: Falls - Risk of  Goal: *Absence of falls  Outcome: Resolved/Met Date Met:  01/17/15  Pt steady on feet.  Ambulating in room

## 2015-01-17 NOTE — Progress Notes (Signed)
MD Bittrick notified about patient's suicidal thoughts, he is putting orders in now. Oncoming RN Elpidio AnisJennifer Thompson notified.

## 2015-01-17 NOTE — Progress Notes (Addendum)
Patient states that he has battled with depression for year,but since his wife died in mid December he has had thoughts of suicide. He states" I am hearing voices that say I should kill myself." Coordinator Tad Mooreonya Bolton notified. MD Bittrick paged awaiting call back. When asked if patient had a plan for killing himself he states" No" we currently have a staff member posted in patient's room till we her back from MD Bittrick.

## 2015-01-17 NOTE — Progress Notes (Signed)
[facility]  Pt has been set up with screen and is currently in pt room with evaluation being done.

## 2015-01-17 NOTE — Progress Notes (Signed)
Verbal bedside report received from Madlyn FrankelNelinda Naji, RN. Assumed care of patient.  Sitter at bedside secondary to suicide watch.

## 2015-01-17 NOTE — Brief Op Note (Signed)
Cardiac Catheterization Procedure Note    Patient ID:     Name: Miguel Ellis   Medical Record Number: [id number]   Date of Birth: [birth year]    Date of Procedure: 01/17/2015    Physician: Hilliard ClarkJon M Summer Parthasarathy, MD    Indications: This is a [age] yrs male who presents with angina.     Blood loss less than 5 ml    Sedation none    Specimen: None    No complications    No assistants    Time out, Mallampati, and ASA performed    Procedure:  After informed consent, patient was prepped and draped in the usual sterile fashion. The right wrist was infiltrated with lidocaine. The right radial artery was accessed via the modified Seldinger technique with a 6 French sheath.  86cc Visipaque contrast were utilized for the entire procedure.     FINDINGS    Left Ventricle: 65%    Left Main:normal    Left Anterior descending coronary artery: minimal disease    Left Circumflex coronary artery: minimal disease    Right coronary artery: minimal disease    Conclusions: mild cad    Recommentations: med tx      Signed By: Hilliard ClarkJon M Cataleyah Colborn, MD

## 2015-01-17 NOTE — Progress Notes (Signed)
Dual skin assessment completed, no redness or break down noted to sacrum, big toe on the right foot has a callus. No other abnormalities noted.

## 2015-01-17 NOTE — Progress Notes (Addendum)
Pt informed RN that he hates feeling this way "like I want to hurt myself."  He stated that his daughter died in October of Leukemia and that his wife died of a heart attack on December 31st.  Pt asked "Am I still going to have the sitter"  RN let him know that he was as we are going to keep him safe.  RN also inquired if he has felt this way before.  He stated yes but was unable to give specifics.        1350:  Spoke with pt who asked when the psychiatrist would be coming.  Explained that call started at 1600 and we would hear after that.  He asked if the interview would take place over a monitor.  I asked if he meant like a face time thing and he stated yes.  He stated that he has had it that way before.      1620:  Pt in room.  RN has called Tele-Psych and screen is on unit.  Awaiting return call from psychiatrist on call.

## 2015-01-18 MED ORDER — QUETIAPINE 25 MG TAB
25 mg | Freq: Every evening | ORAL | Status: DC
Start: 2015-01-18 — End: 2015-01-20
  Administered 2015-01-18 – 2015-01-20 (×3): via ORAL

## 2015-01-18 MED ORDER — ACETAMINOPHEN 500 MG TAB
500 mg | Freq: Four times a day (QID) | ORAL | Status: DC | PRN
Start: 2015-01-18 — End: 2015-01-20
  Administered 2015-01-18 – 2015-01-20 (×2): via ORAL

## 2015-01-18 MED FILL — METOPROLOL TARTRATE 50 MG TAB: 50 mg | ORAL | Qty: 1

## 2015-01-18 MED FILL — ATORVASTATIN 40 MG TAB: 40 mg | ORAL | Qty: 1

## 2015-01-18 MED FILL — LISINOPRIL 20 MG TAB: 20 mg | ORAL | Qty: 2

## 2015-01-18 MED FILL — MAPAP EXTRA STRENGTH 500 MG TABLET: 500 mg | ORAL | Qty: 1

## 2015-01-18 MED FILL — LOVENOX 40 MG/0.4 ML SUBCUTANEOUS SYRINGE: 40 mg/0.4 mL | SUBCUTANEOUS | Qty: 0.4

## 2015-01-18 MED FILL — QUETIAPINE 25 MG TAB: 25 mg | ORAL | Qty: 4

## 2015-01-18 MED FILL — FLUOXETINE 20 MG CAP: 20 mg | ORAL | Qty: 2

## 2015-01-18 NOTE — Progress Notes (Signed)
Bedside and Verbal shift change report received from Young HarrisJenny, [place]RN. Report included the following information SBAR, Kardex, Intake/Output, MAR, Recent Results and Cardiac Rhythm SB.

## 2015-01-18 NOTE — Progress Notes (Signed)
01/18/2015 7:45 AM    Admit Date: 01/16/2015    Admit Diagnosis: Coronary artery disease involving native coronary artery of*      Subjective:    Patient has minimal cad. His problem is psych. Await placement    Objective:      Visit Vitals   ??? BP 118/73 (BP 1 Location: Left arm, BP Patient Position: At rest)   ??? Pulse (!) 50   ??? Temp 97.7 ??F (36.5 ??C)   ??? Resp 19   ??? Ht 5\' 11"  (1.803 m)   ??? Wt 92.3 kg (203 lb 8 oz)   ??? SpO2 95%   ??? BMI 28.38 kg/m2       ROS:  General ROS: negative for - chills  Hematological and Lymphatic ROS: negative for - blood clots or jaundice  Respiratory ROS: no cough, shortness of breath, or wheezing  Cardiovascular ROS: no chest pain or dyspnea on exertion  Gastrointestinal ROS: no abdominal pain, change in bowel habits, or black or bloody stools  Neurological ROS: no TIA or stroke symptoms    Physical Exam:    Physical Examination: General appearance - alert, well appearing, and in no distress  Mental status - alert, oriented to person, place, and time  Eyes - pupils equal and reactive, extraocular eye movements intact  Neck/lymph - supple, no significant adenopathy  Chest/CV - clear to auscultation, no wheezes, rales or rhonchi, symmetric air entry  Heart - normal rate, regular rhythm, normal S1, S2, no murmurs, rubs, clicks or gallops  Abdomen/GI - soft, nontender, nondistended, no masses or organomegaly  Musculoskeletal - no joint tenderness, deformity or swelling  Extremities - peripheral pulses normal, no pedal edema, no clubbing or cyanosis  Skin - normal coloration and turgor, no rashes, no suspicious skin lesions noted    Current Facility-Administered Medications   Medication Dose Route Frequency   ??? FLUoxetine (PROzac) capsule 40 mg  40 mg Oral DAILY   ??? 0.9% sodium chloride infusion  75 mL/hr IntraVENous CONTINUOUS   ??? sodium chloride (NS) flush 5-10 mL  5-10 mL IntraVENous Q8H   ??? sodium chloride (NS) flush 5-10 mL  5-10 mL IntraVENous PRN    ??? QUEtiapine (SEROquel) tablet 100 mg  100 mg Oral QHS   ??? acetaminophen (TYLENOL) tablet 500 mg  500 mg Oral Q6H PRN   ??? lisinopril (PRINIVIL, ZESTRIL) tablet 40 mg  40 mg Oral DAILY   ??? nitroglycerin (NITROSTAT) tablet 0.4 mg  0.4 mg SubLINGual Q5MIN PRN   ??? enoxaparin (LOVENOX) injection 40 mg  40 mg SubCUTAneous Q24H   ??? metoprolol tartrate (LOPRESSOR) tablet 50 mg  50 mg Oral BID   ??? atorvastatin (LIPITOR) tablet 40 mg  40 mg Oral DAILY       Data Review:   @LABRCNT (Na,K,BUN,CREA,WBC,HGB,HCT,PLT,INR,TRP,TCHOL*,Triglyceride*,LDL*,LDLCPOC HDL*,HDL])@    TELEMETRY: nsr    Assessment/Plan:     Active Problems:    Coronary artery disease involving native coronary artery of native heart with unstable angina pectoris (HCC) (01/16/2015) The current medical regimen is effective;  continue present plan and medications.        Elevated lipids (01/16/2015)  The current medical regimen is effective;  continue present plan and medications.        HTN (hypertension) (01/16/2015) The current medical regimen is effective;  continue present plan and medications.        Major depressive disorder (01/17/2015) The current medical regimen is effective;  continue present plan and medications.  Overview: With prior history of same      Multiple recent losses          Hilliard ClarkJon M Egidio Lofgren, MD

## 2015-01-18 NOTE — Progress Notes (Signed)
LMSW alerted yesterday that tele psych eval had been ordered for pt after he expressed suicidal ideations to staff.  Sitter in place.  Follow up today.  Hospitalist has also seen pt and commitment papers were completed.  Initial referral information called to [facility] to place pt on list for a bed.  Pt is uninsured.  He reports hx of depression with current auditory hallucinations and suicidal ideations.  Will alert coworker to follow up.

## 2015-01-18 NOTE — Progress Notes (Signed)
Verbal bedside report received from Standley DakinsAmy Wilson, RN. Assumed care of patient. Pt has sitter at bedside secondary to suicide precautions.

## 2015-01-18 NOTE — Progress Notes (Signed)
Wife died on dec 31st.  Married 3739 years  Daughter died in October.  She was 58 yrs old.  Deep spiritual man  Involved in faith community.  Understands grief process  York SpanielSaid he needs time to gather his thoughts.  He doesn't want to be overloaded with people.  Guided patient in some steps that may help him process his losses.  Left contact information for patient.  Very open visit.  Signed by Farrel Gobbleandy Brookshire, MDiv, chaplain

## 2015-01-18 NOTE — Progress Notes (Signed)
Pt has been on suicide precautions for the shift.  He has had a sitter since the beginning of the shift to assure pt safety.  Pt has been sleeping much of the day.  He has a flat affect but states that he appreciates that we want to help him.  Pt is awaiting placement into a facility to work on his depression and suicidal thoughts.

## 2015-01-18 NOTE — Progress Notes (Signed)
Hospitalist Progress Note    01/18/2015 2:22 PM  Admit Date: 01/16/2015  9:25 PM   NAME: Miguel CrandallDarius Ellis   DOB:  May 31, 1955   MRN:  [id number]   Attending: Hilliard ClarkJon M Bittrick, MD  PCP:  None  Treatment Team: Attending Provider: Hilliard ClarkJon M Bittrick, MD; Utilization Review: Greta DoomMiriam K Odom, RN; Consulting Provider: Sinda DuShumin Zhang, MD; Consulting Provider: Rushie GoltzJose R Guerra Orellana, MD  SUBJECTIVE:       Miguel Ellis is a 60 yo AAM admitted to cardiology for chest pain s/p LHC recommending medical management. Additionally endorses depression with suicidal ideation and auditory hallucinations. Evaluated per telepsyche, seroquel added to prozac. Has 1:1 sitter and agrees to voluntary psyche placement. Self pay status limits options and SW aware     1-15 sitter at bedside, endorses suicidal ideation and hallucinations       No results found for this or any previous visit (from the past 24 hour(s)).    Allergies   Allergen Reactions   ??? Aspirin Hives     Current Facility-Administered Medications   Medication Dose Route Frequency Provider Last Rate Last Dose   ??? FLUoxetine (PROzac) capsule 40 mg  40 mg Oral DAILY Hilliard ClarkJon M Bittrick, MD   40 mg at 01/18/15 0854   ??? 0.9% sodium chloride infusion  75 mL/hr IntraVENous CONTINUOUS Hilliard ClarkJon M Bittrick, MD   0 mL/hr at 01/17/15 1000   ??? sodium chloride (NS) flush 5-10 mL  5-10 mL IntraVENous Q8H Hilliard ClarkJon M Bittrick, MD   5 mL at 01/18/15 0517   ??? sodium chloride (NS) flush 5-10 mL  5-10 mL IntraVENous PRN Hilliard ClarkJon M Bittrick, MD       ??? QUEtiapine (SEROquel) tablet 100 mg  100 mg Oral QHS Leward QuanJill Heatherington, NP   100 mg at 01/17/15 2105   ??? acetaminophen (TYLENOL) tablet 500 mg  500 mg Oral Q6H PRN Leward QuanJill Heatherington, NP   500 mg at 01/17/15 2105   ??? nitroglycerin (NITROSTAT) tablet 0.4 mg  0.4 mg SubLINGual Q5MIN PRN Hilliard ClarkJon M Bittrick, MD   0.4 mg at 01/17/15 0005   ??? enoxaparin (LOVENOX) injection 40 mg  40 mg SubCUTAneous Q24H Hilliard ClarkJon M Bittrick, MD   40 mg at 01/16/15 2348           Review of Systems as noted above in HPI    PHYSICAL EXAM     Visit Vitals   ??? BP 128/86 (BP 1 Location: Left arm, BP Patient Position: At rest)   ??? Pulse (!) 50   ??? Temp 97.9 ??F (36.6 ??C)   ??? Resp 18   ??? Ht 5\' 11"  (1.803 m)   ??? Wt 92.3 kg (203 lb 8 oz)   ??? SpO2 98%   ??? BMI 28.38 kg/m2      Temp (24hrs), Avg:98 ??F (36.7 ??C), Min:97.3 ??F (36.3 ??C), Max:98.5 ??F (36.9 ??C)    Oxygen Therapy  O2 Sat (%): 98 % (01/18/15 1251)  Pulse via Oximetry: 55 beats per minute (01/16/15 2300)  O2 Device: Room air (01/18/15 0430)  O2 Flow Rate (L/min): 2 l/min (01/16/15 2333)    Intake/Output Summary (Last 24 hours) at 01/18/15 1422  Last data filed at 01/17/15 2331   Gross per 24 hour   Intake              120 ml   Output              375 ml   Net             -  255 ml      General: No acute distress,conversive  Lungs:  CTAB, good effort   Heart:  Regular rate and rhythm, no murmur, no edema   Abdomen: Soft, nontender and nondistended, normal bowel sounds  Extremities: Warm and dry         DIAGNOSTIC STUDIES      Labs and Studies from previous 24 hours have been personally reviewed by myself           Full Code    ASSESSMENT / PLAN     Active Hospital Problems    Diagnosis Date Noted   ??? Chest pain 01/18/2015   ??? Major depressive disorder 01/17/2015     With prior history of same  Multiple recent losses     ??? Coronary artery disease involving native coronary artery of native heart with unstable angina pectoris (HCC) 01/16/2015   ??? Elevated lipids 01/16/2015   ??? HTN (hypertension) 01/16/2015       -continue seroquel and prozac  -placed on committment papers  -has sitter  -SW aware and will followup on placement   -will be available as needed, please call thanks    Signed By: Christiana Pellant, MD     January 18, 2015

## 2015-01-18 NOTE — Progress Notes (Signed)
Verbal bedside report given to Boneta LucksJenny, Public relations account executiveoncoming RN. Patient's situation, background, assessment and recommendations provided. Opportunity for questions provided. Oncoming RN assumed care of patient. Sitter at bedside.

## 2015-01-18 NOTE — Progress Notes (Signed)
Verbal bedside report given to Amy Wilson, oncoming RN. Patient's situation, background, assessment and recommendations provided. Opportunity for questions provided. Oncoming RN assumed care of patient.

## 2015-01-19 MED FILL — LOVENOX 40 MG/0.4 ML SUBCUTANEOUS SYRINGE: 40 mg/0.4 mL | SUBCUTANEOUS | Qty: 0.4

## 2015-01-19 MED FILL — FLUOXETINE 20 MG CAP: 20 mg | ORAL | Qty: 2

## 2015-01-19 MED FILL — QUETIAPINE 25 MG TAB: 25 mg | ORAL | Qty: 4

## 2015-01-19 NOTE — Progress Notes (Signed)
Patient states he still is hearing voices that are telling him to "kill himself." Sitter present. Room searched for contraband. Contraband items (his backpack and metal shaving cream can) removed from room. Will continue to monitor patient. Pt has a flat affect, but calm and cooperative.

## 2015-01-19 NOTE — Progress Notes (Signed)
Report given to Heather, RN.

## 2015-01-19 NOTE — Progress Notes (Signed)
Hospitalist Progress Note    01/19/2015  Admit Date: 01/16/2015  9:25 PM   NAME: Miguel Ellis   DOB:  1955/05/01   MRN:  [id number]   Attending: Hilliard Clark, MD  PCP:  None    SUBJECTIVE:   Patient presented with cp.  LHC with min disease and recommending med management.  Currently on commitment papers for suicide ideation, awaiting inpatient psych.        Review of Systems negative with exception of pertinent positives noted above  PHYSICAL EXAM     Visit Vitals   ??? BP (!) 136/94 (BP 1 Location: Right arm, BP Patient Position: At rest)   ??? Pulse (!) 53   ??? Temp 97.9 ??F (36.6 ??C)   ??? Resp 18   ??? Ht  (1.803 m)   ??? Wt 91 kg (200 lb 11.2 oz)   ??? SpO2 98%   ??? BMI 27.99 kg/m2      Temp (24hrs), Avg:97.5 ??F (36.4 ??C), Min:97 ??F (36.1 ??C), Max:97.9 ??F (36.6 ??C)    Oxygen Therapy  O2 Sat (%): 98 % (01/19/15 1131)  Pulse via Oximetry: 54 beats per minute (01/18/15 2300)  O2 Device: Room air (01/19/15 1131)  O2 Flow Rate (L/min): 2 l/min (01/16/15 2333)    Intake/Output Summary (Last 24 hours) at 01/19/15 1141  Last data filed at 01/19/15 1117   Gross per 24 hour   Intake              120 ml   Output              150 ml   Net              -30 ml      General: No acute distress????  Lungs:  CTA Bilaterally.   Heart:  Regular rate and rhythm,?? No murmur, rub, or gallop  Abdomen: Soft, Non distended, Non tender, Positive bowel sounds  Extremities: No cyanosis, clubbing or edema  Neurologic:?? No focal deficits    ASSESSMENT      Active Hospital Problems    Diagnosis Date Noted   ??? Chest pain 01/18/2015   ??? Major depressive disorder 01/17/2015     With prior history of same  Multiple recent losses     ??? Coronary artery disease involving native coronary artery of native heart with unstable angina pectoris (HCC) 01/16/2015   ??? Elevated lipids 01/16/2015   ??? HTN (hypertension) 01/16/2015     Plan:  ?? Continue seroquel per tele psych  ?? SW for psych placement  ?? Pt in no cardiac distress.  Can assume care if cardio wishes     DVT Prophylaxis: lovenox    Signed By: Matilde Haymaker, MD     January 19, 2015

## 2015-01-19 NOTE — Progress Notes (Signed)
01/19/2015 7:45 AM    Admit Date: 01/16/2015    Admit Diagnosis: Coronary artery disease involving native coronary artery of*      Subjective:    Patient has minimal cad. His problem is psych. Await placement    Objective:      Visit Vitals   ??? BP 123/86 (BP 1 Location: Right arm, BP Patient Position: At rest)   ??? Pulse (!) 51   ??? Temp 97.8 ??F (36.6 ??C)   ??? Resp 16   ??? Ht  (1.803 m)   ??? Wt 91 kg (200 lb 11.2 oz)   ??? SpO2 99%   ??? BMI 27.99 kg/m2       ROS:  General ROS: negative for - chills  Hematological and Lymphatic ROS: negative for - blood clots or jaundice  Respiratory ROS: no cough, shortness of breath, or wheezing  Cardiovascular ROS: no chest pain or dyspnea on exertion  Gastrointestinal ROS: no abdominal pain, change in bowel habits, or black or bloody stools  Neurological ROS: no TIA or stroke symptoms    Physical Exam:    Physical Examination: General appearance - alert, well appearing, and in no distress  Mental status - alert, oriented to person, place, and time  Eyes - pupils equal and reactive, extraocular eye movements intact  Neck/lymph - supple, no significant adenopathy  Chest/CV - clear to auscultation, no wheezes, rales or rhonchi, symmetric air entry  Heart - normal rate, regular rhythm, normal S1, S2, no murmurs, rubs, clicks or gallops  Abdomen/GI - soft, nontender, nondistended, no masses or organomegaly  Musculoskeletal - no joint tenderness, deformity or swelling  Extremities - peripheral pulses normal, no pedal edema, no clubbing or cyanosis  Skin - normal coloration and turgor, no rashes, no suspicious skin lesions noted    Current Facility-Administered Medications   Medication Dose Route Frequency   ??? FLUoxetine (PROzac) capsule 40 mg  40 mg Oral DAILY   ??? sodium chloride (NS) flush 5-10 mL  5-10 mL IntraVENous PRN   ??? QUEtiapine (SEROquel) tablet 100 mg  100 mg Oral QHS   ??? acetaminophen (TYLENOL) tablet 500 mg  500 mg Oral Q6H PRN    ??? nitroglycerin (NITROSTAT) tablet 0.4 mg  0.4 mg SubLINGual Q5MIN PRN   ??? enoxaparin (LOVENOX) injection 40 mg  40 mg SubCUTAneous Q24H       Data Review:   (Na,K,BUN,CREA,WBC,HGB,HCT,PLT,INR,TRP,TCHOL*,Triglyceride*,LDL*,LDLCPOC HDL*,HDL])@    TELEMETRY: nsr    Assessment/Plan:     Active Problems:    Coronary artery disease involving native coronary artery of native heart with unstable angina pectoris (HCC) (01/16/2015) The current medical regimen is effective;  continue present plan and medications.        Elevated lipids (01/16/2015)  The current medical regimen is effective;  continue present plan and medications.        HTN (hypertension) (01/16/2015) The current medical regimen is effective;  continue present plan and medications.        Major depressive disorder (01/17/2015) The current medical regimen is effective;  continue present plan and medications.        Overview: With prior history of same      Multiple recent losses          Hilliard Clark, MD

## 2015-01-19 NOTE — Progress Notes (Signed)
Bedside and Verbal shift change report given to self (oncoming nurse) by Burnell Blanks, RN (offgoing nurse). Report included the following information SBAR, Kardex, MAR and Cardiac Rhythm SB. Sitter present for suicide precautions.

## 2015-01-19 NOTE — Progress Notes (Signed)
SW met with pt in room today. Pt reports still experiencing suicidal ideation.   Only child, a daughter, died of leukemia in 11/07/2013. He and his wife relocated with her job to [place] several weeks ago. His wife died suddenly on 01-08-2022 of this year. No family in the area.  Limited support. Family is in [place].  Pt is employed . No insurance.  Agreeable to psych eval and hospitalization.  Referrals transmitted to several local psych hospitals. He is 16th on the waiting list at [facility];   [facility], [facility] and [facility] are full.  Faxed referrals to [facility] and [facility]. Will follow up tomorrow to see if they are willing to take him.  Finding a psych facility is complicated by the fact that pt has no insurance.  Will widen our search to [facility], [facility] and [facility] tomorrow if [facility] and [facility] decline pt.

## 2015-01-19 NOTE — Progress Notes (Signed)
Verbal bedside report given to Krystle, oncoming RN. Patient's situation, background, assessment and recommendations provided. Opportunity for questions provided. Oncoming RN assumed care of patient.

## 2015-01-20 MED ORDER — ATORVASTATIN 40 MG TAB
40 mg | Freq: Every day | ORAL | Status: DC
Start: 2015-01-20 — End: 2015-01-20
  Administered 2015-01-20: 14:00:00 via ORAL

## 2015-01-20 MED ORDER — ATORVASTATIN 40 MG TAB
40 mg | ORAL_TABLET | Freq: Every day | ORAL | 0 refills | Status: AC
Start: 2015-01-20 — End: ?

## 2015-01-20 MED ORDER — QUETIAPINE 100 MG TAB
100 mg | ORAL_TABLET | Freq: Every evening | ORAL | 0 refills | Status: AC
Start: 2015-01-20 — End: ?

## 2015-01-20 MED ORDER — LISINOPRIL-HYDROCHLOROTHIAZIDE 20 MG-25 MG TAB
20-25 mg | Freq: Every day | ORAL | Status: DC
Start: 2015-01-20 — End: 2015-01-20
  Administered 2015-01-20: 14:00:00 via ORAL

## 2015-01-20 MED ORDER — LISINOPRIL-HYDROCHLOROTHIAZIDE 20 MG-25 MG TAB
20-25 mg | ORAL_TABLET | Freq: Every day | ORAL | 0 refills | Status: AC
Start: 2015-01-20 — End: ?

## 2015-01-20 MED FILL — LISINOPRIL-HYDROCHLOROTHIAZIDE 20 MG-25 MG TAB: 20-25 mg | ORAL | Qty: 1

## 2015-01-20 MED FILL — ATORVASTATIN 40 MG TAB: 40 mg | ORAL | Qty: 1

## 2015-01-20 MED FILL — LOVENOX 40 MG/0.4 ML SUBCUTANEOUS SYRINGE: 40 mg/0.4 mL | SUBCUTANEOUS | Qty: 0.4

## 2015-01-20 MED FILL — MAPAP EXTRA STRENGTH 500 MG TABLET: 500 mg | ORAL | Qty: 1

## 2015-01-20 MED FILL — QUETIAPINE 25 MG TAB: 25 mg | ORAL | Qty: 4

## 2015-01-20 MED FILL — FLUOXETINE 20 MG CAP: 20 mg | ORAL | Qty: 2

## 2015-01-20 NOTE — Progress Notes (Signed)
Called spoke with staff at [facility], requested additional information to be faxed for further review, information faxed. Will await response.    Called spoke with Victorino Dike at [facility] states they have received information but has not had a chance to look it over and are not sure of the dc at this time, states will get back with use after they have a chance to review pts chart.    Called [facility]-- no adult beds    Called [facility] states they only do MD to MD referrals now and do not accept faxed referrals but that they did not have any adult beds, but the MD could to the referral if he wanted to.    Called spoke with  Gerarda Gunther at [facility], will fax information to them for assessment.

## 2015-01-20 NOTE — Progress Notes (Signed)
PSYCHOLOGY PROGRESS NOTE      Name:  Miguel Ellis    Date of Service: 01/20/2015  Location of Service:   327/01  Type of Service: Psychodiagnostic Evaluation  Duration:  60 minutes  Primary Diagnosis:   1. Acute chest pain        Summary of Service:  Engaged in psychdiagnostic interview with patient.  Patient presently meets criteria for Major Depressive Disorder, Recurrent, Severe, with suicidal ideations, with psychotic features.  Psychotic features began after his wife died 2 weeks ago, and take the form of auditory hallucinations to kill self.  At this time the patient reports that he does intend to kill himself, but does not have a plan.     Patient has prior psychiatric hospitalizations, both for suicide attempts.  Last hospitalization occurred at age 42 after his mother died    Patient reports high blood pressure problems as well as 2 cardiac stents after heart attack 5-6 years ago.    In addition to experiencing the deaths of his mother and wife, his daughter also died in late October 06, 2016after a battle with leukemia.  Patient also lost his son at 34 months of age.    Patient denies current alcohol, tobacco, and drug abuse.      Mr. 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  He has no history of learning disabilities, and graduated college in Radiation protection practitioner. Previous jobs include radio production, and radio talk show host.     He is looking forward to inpatient psychiatric hospitalization to begin to heal.    Patient enjoys cooking, writing, keeping a diary, riding his bike, swimming, and power walking.      Recommendations:     1) It is recommended that Mr. Fjeld continue to pursue inpatient psychiatric hospitalization.    2) It is recommended that Mr Savastano enter both individual and group psychotherapy. He has excellent insight and is amenable to making change from insight-oriented as well as cognitive-behavioral approaches.       3) Recommended starting points for therapy include addressing the patient's need for closure with regard to his wife's death, as well as his feelings of guilt and need for forgiveness surrounding marital difficulties which were not resolved prior to her death.    Psychology will sign off.       Dortha Kern, Psy.D.  Psychologist

## 2015-01-20 NOTE — Progress Notes (Signed)
Patient has order for IP Consult to Cardiac Rehab for diagnosis of Routine Post Cath. Upon review of chart, it is noted that patient does not have a diagnosis qualifying him for cardiac rehab. Patient will not be contacted.

## 2015-01-20 NOTE — Progress Notes (Signed)
Bedside and Verbal shift change report given to Krystle, RN (oncoming nurse) by self (offgoing nurse). Report included the following information SBAR and Kardex.

## 2015-01-20 NOTE — Progress Notes (Addendum)
Received call back from [facility] from [facility], states they are willing to accept pt today, report # received for Primary  RN will give information to them [phone].    MD accepting at facility is Dr Seward Speck with Dr [facility] Rainbow, will get DC paperwork ready.    Will contact [facility] For transport to [facility]. Called spoke with staff at [facility] office states will give information to sargent and will get back with me about at time. States will be here in approx 30 min to transport pt. Dr Roseanna Rainbow made aware and primary nurse aware of dc time.

## 2015-01-20 NOTE — Progress Notes (Signed)
Transport packet complete, emtala form complete. Waiting on [facility] sheriffs office for transport.    Care Management Interventions  Mode of Transport at Discharge: Other (see comment) ([place] Sherriffs office)  Transition of Care Consult (CM Consult): Other  Plan discussed with Pt/Family/Caregiver: Yes  Freedom of Choice Offered: Yes

## 2015-01-20 NOTE — Discharge Summary (Signed)
Hospitalist Discharge Summary     Patient ID:  Miguel Ellis  [id number]  59 y.o.  [birth year]  Admit date: 01/16/2015  9:25 PM  Discharge date and time: 01/20/2015  Attending: Matilde Haymaker, MD  PCP:  None  Treatment Team: Attending Provider: Matilde Haymaker, MD; Utilization Review: Greta Doom, RN; Consulting Provider: Sinda Du, MD; Consulting Provider: Rushie Goltz, MD; Care Manager: Altamese Cabal, LISW    Principal Diagnosis Major depressive disorder   Principal Problem:    Major depressive disorder (01/17/2015)      Overview: With prior history of same      Multiple recent losses    Active Problems:    Coronary artery disease involving native coronary artery of native heart with unstable angina pectoris (HCC) (01/16/2015)      Elevated lipids (01/16/2015)      HTN (hypertension) (01/16/2015)      Chest pain (01/18/2015)     HPI: Patient presents with new onset sharp midsternal cp today. Worse with activity. Radiating down his arm. Started today. Present at rest but worse with activity. No nausea or diaphoresis. Similar to when he had heart attack 6 years ago in [place]. Two stents at that time. He claims asa allergy with hives.   ??    Hospital Course:  Please refer to the admission H&P for details of presentation. In summary, the patient presented with chest pain and underwent cardiac cath showing minimal disease.  Pt started on lipitor 40 mg po daily and prinzide 20-25 mg po daily.  Pt with recent deaths of daughter and wife.  Noted suicide ideation.  Seen by telepsych and started on seroquel 100 mg po qhs.  prozac continued.  [facility] to continue treatment.  Will transfer pt.  Recommend outpatient follow up with pcp      Labs: Results:       Chemistry No results for input(s): GLU, NA, K, CL, CO2, BUN, CREA, CA, AGAP, BUCR, TBIL, GPT, AP, TP, ALB, GLOB, AGRAT in the last 72 hours.    CBC w/Diff No results for input(s): WBC, RBC, HGB, HCT, PLT, GRANS, LYMPH, EOS, HGBEXT, HCTEXT, PLTEXT in the last 72 hours.   Cardiac Enzymes No results for input(s): CPK, CKND1, MYO in the last 72 hours.    No lab exists for component: CKRMB, TROIP   Coagulation No results for input(s): PTP, INR, APTT in the last 72 hours.    No lab exists for component: INREXT    Lipid Panel Lab Results   Component Value Date/Time    Cholesterol, total 148 01/17/2015 05:06 AM    HDL Cholesterol 33 01/17/2015 05:06 AM    LDL, calculated 96.8 01/17/2015 05:06 AM    VLDL, calculated 18.2 01/17/2015 05:06 AM    Triglyceride 91 01/17/2015 05:06 AM    CHOL/HDL Ratio 4.5 01/17/2015 05:06 AM      BNP No results for input(s): BNPP in the last 72 hours.   Liver Enzymes No results for input(s): TP, ALB, TBIL, AP, SGOT, GPT in the last 72 hours.    No lab exists for component: DBIL   Thyroid Studies No results found for: T4, T3U, TSH, TSHEXT         Discharge Exam:  Visit Vitals   ??? BP 128/87 (BP 1 Location: Left arm, BP Patient Position: Sitting)   ??? Pulse (!) 55   ??? Temp 97.8 ??F (36.6 ??C)   ???  Resp 16   ??? Ht  (1.803 m)   ??? Wt 89.7 kg (197 lb 12.8 oz)   ??? SpO2 100%   ??? BMI 27.59 kg/m2     General appearance: alert, cooperative, no distress, appears stated age  Lungs: clear to auscultation bilaterally  Heart: regular rate and rhythm, S1, S2 normal, no murmur, click, rub or gallop  Abdomen: soft, non-tender. Bowel sounds normal. No masses,  no organomegaly  Extremities: no cyanosis or edema  Neurologic: Grossly normal    Disposition: SNF  Discharge Condition: stable  Patient Instructions:   Current Discharge Medication List      START taking these medications    Details   atorvastatin (LIPITOR) 40 mg tablet Take 1 Tab by mouth daily.  Qty: 1 Tab, Refills: 0      lisinopril-hydroCHLOROthiazide (PRINZIDE, ZESTORETIC) 20-25 mg per tablet Take 1 Tab by mouth daily.  Qty: 1 Tab, Refills: 0       QUEtiapine (SEROQUEL) 100 mg tablet Take 1 Tab by mouth nightly.  Qty: 1 Tab, Refills: 0         CONTINUE these medications which have NOT CHANGED    Details   FLUoxetine (PROZAC) 40 mg capsule Take 40 mg by mouth daily. Indications: DEPRESSION ASSOCIATED WITH BIPOLAR DISORDER, ADJUNCT         STOP taking these medications       lisinopril (PRINIVIL, ZESTRIL) 40 mg tablet Comments:   Reason for Stopping:               Activity: Activity as tolerated  Diet: Regular Diet  Wound Care: None needed    Follow-up  ??   With pcp  Time spent to discharge patient 35 minutes  Signed:  Matilde Haymaker, MD  01/20/2015  12:13 PM

## 2015-01-20 NOTE — Progress Notes (Signed)
Report received from Heather, RN.

## 2015-01-20 NOTE — Progress Notes (Signed)
Problem: Falls - Risk of  Goal: *Absence of falls  Outcome: Progressing Towards Goal  Pt progressing towards goal. No falls since admission. Bed low and locked. Call light within reach. Side rails x 2. Gripper socks applied. Personal belongings within reach. Pt verbalizes understanding to call for assistance.

## 2015-01-20 NOTE — Progress Notes (Signed)
[facility] PROGRESS NOTE           01/20/2015 7:56 AM    Admit Date: 01/16/2015      Subjective:   No new events    ROS:  Cardiovascular:  As noted above    Objective:      Vitals:    01/19/15 2127 01/20/15 0037 01/20/15 0455 01/20/15 0753   BP: (!) 144/93 129/82 (!) 146/93 (!) 140/96   Pulse: 62 (!) 53 (!) 51 (!) 52   Resp: Temp: 97.7 ??F (36.5 ??C) 96.8 ??F (36 ??C) 96.7 ??F (35.9 ??C) 98.5 ??F (36.9 ??C)   SpO2: 99% 99% 98% 98%   Weight:   89.7 kg (197 lb 12.8 oz)    Height:           Physical Exam:  General-No Acute Distress  Neck- supple, no JVD  CV- regular rate and rhythm no MRG  Lung- clear bilaterally  Abd- soft, nontender, nondistended  Ext- no edema bilaterally.  Skin- warm and dry    Data Review:   No results for input(s): NA, K, MG, BUN, CREA, GLU, WBC, HGB, HCT, PLT, INR, TROIQ, CHOL, TGL, LDLC, HDL, HGBEXT, HCTEXT, PLTEXT in the last 72 hours.    No lab exists for component: TROIP, HDLC, TRP, INREXT    Assessment/Plan:     Active Problems:    Coronary artery disease involving native coronary artery of native heart with unstable angina pectoris (HCC) (01/16/2015) - Minimal CAD.  No additional work up necessary.  Allergic to ASA.      Elevated lipids (01/16/2015) - add statin therapy      HTN (hypertension) (01/16/2015) - Add lisinopril/HCTZ      Major depressive disorder (01/17/2015) - Per primary team      Overview: With prior history of same      Multiple recent losses      Chest pain (01/18/2015) - Non-cardiac.      Will be on standby          Arnoldo Morale, MD  01/20/2015 7:56 AM

## 2015-01-27 DIAGNOSIS — F333 Major depressive disorder, recurrent, severe with psychotic symptoms: Secondary | ICD-10-CM

## 2015-01-27 NOTE — ED Triage Notes (Addendum)
C/o si with plan to overdose on sleeping pills. States has access to pills at home. Onset of symptoms 02/01/15 after wife passed away. States discharged from psychiatric facility earlier today in [place], states onset of symptoms after discharge. States hx suicidal attempts in past 09/2014 with overdose on pills. Denies psychiatrist. Pt arrives alone to ed. Also c/o hearing voices "telling me to hurt myself".

## 2015-01-28 ENCOUNTER — Inpatient Hospital Stay
Admit: 2015-01-28 | Discharge: 2015-01-30 | Disposition: A | Discharge status: Home / Self Care | Payer: Self-pay | Attending: Emergency Medicine

## 2015-01-28 LAB — ACETAMINOPHEN: Acetaminophen level: 10 ug/mL (ref 10.0–30.0)

## 2015-01-28 LAB — DRUG SCREEN, URINE
AMPHETAMINES: POSITIVE
BARBITURATES: NEGATIVE
BENZODIAZEPINES: NEGATIVE
COCAINE: NEGATIVE
METHADONE: NEGATIVE
OPIATES: NEGATIVE
PCP(PHENCYCLIDINE): NEGATIVE
THC (TH-CANNABINOL): NEGATIVE

## 2015-01-28 LAB — ETHYL ALCOHOL: ALCOHOL(ETHYL),SERUM: <3 MG/DL

## 2015-01-28 LAB — METABOLIC PANEL, BASIC
Anion gap: 6 mmol/L — ABNORMAL LOW (ref 7–16)
BUN: 20 MG/DL (ref 6–23)
CO2: 32 mmol/L (ref 21–32)
Calcium: 8.8 MG/DL (ref 8.3–10.4)
Chloride: 103 mmol/L (ref 98–107)
Creatinine: 1.33 MG/DL (ref 0.8–1.5)
GFR est AA: >60 mL/min/{1.73_m2} (ref >60)
GFR est non-AA: 59 mL/min/{1.73_m2} — ABNORMAL LOW (ref >60)
Glucose: 92 mg/dL (ref 65–100)
Potassium: 4.2 mmol/L (ref 3.5–5.1)
Sodium: 141 mmol/L (ref 136–145)

## 2015-01-28 LAB — CBC WITH AUTOMATED DIFF
ABS. BASOPHILS: 0 10*3/uL (ref 0.0–0.2)
ABS. EOSINOPHILS: 0.2 10*3/uL (ref 0.0–0.8)
ABS. IMM. GRANS.: 0 10*3/uL (ref 0.0–0.5)
ABS. LYMPHOCYTES: 1.9 10*3/uL (ref 0.5–4.6)
ABS. MONOCYTES: 0.4 10*3/uL (ref 0.1–1.3)
ABS. NEUTROPHILS: 3.7 10*3/uL (ref 1.7–8.2)
BASOPHILS: 0 % (ref 0.0–2.0)
EOSINOPHILS: 3 % (ref 0.5–7.8)
HCT: 43.5 % (ref 41.1–50.3)
HGB: 14.1 g/dL (ref 13.6–17.2)
IMMATURE GRANULOCYTES: 0.2 % (ref 0.0–5.0)
LYMPHOCYTES: 31 % (ref 13–44)
MCH: 26.9 PG (ref 26.1–32.9)
MCHC: 32.4 g/dL (ref 31.4–35.0)
MCV: 82.9 FL (ref 79.6–97.8)
MONOCYTES: 6 % (ref 4.0–12.0)
MPV: 9.8 FL — ABNORMAL LOW (ref 10.8–14.1)
NEUTROPHILS: 60 % (ref 43–78)
PLATELET: 288 10*3/uL (ref 150–450)
RBC: 5.25 M/uL (ref 4.23–5.67)
RDW: 15 % — ABNORMAL HIGH (ref 11.9–14.6)
WBC: 6.2 10*3/uL (ref 4.3–11.1)

## 2015-01-28 LAB — SALICYLATE: Salicylate level: <1.7 MG/DL — ABNORMAL LOW (ref 2.8–20.0)

## 2015-01-28 MED ORDER — DIPHENHYDRAMINE 25 MG CAP
25 mg | Freq: Four times a day (QID) | ORAL | Status: DC | PRN
Start: 2015-01-28 — End: 2015-01-30
  Administered 2015-01-28 – 2015-01-30 (×3): via ORAL

## 2015-01-28 MED ORDER — ACETAMINOPHEN 500 MG TAB
500 mg | ORAL | Status: AC
Start: 2015-01-28 — End: 2015-01-27
  Administered 2015-01-28: 03:00:00 via ORAL

## 2015-01-28 MED ORDER — FLUOXETINE 20 MG CAP
20 mg | Freq: Every day | ORAL | Status: DC
Start: 2015-01-28 — End: 2015-01-30
  Administered 2015-01-28 – 2015-01-30 (×3): via ORAL

## 2015-01-28 MED ORDER — ONDANSETRON 4 MG TAB, RAPID DISSOLVE
4 mg | ORAL | Status: AC
Start: 2015-01-28 — End: 2015-01-28
  Administered 2015-01-28: 14:00:00 via ORAL

## 2015-01-28 MED FILL — MAPAP EXTRA STRENGTH 500 MG TABLET: 500 mg | ORAL | Qty: 2

## 2015-01-28 MED FILL — DIPHENHYDRAMINE 25 MG CAP: 25 mg | ORAL | Qty: 1

## 2015-01-28 MED FILL — ONDANSETRON 4 MG TAB, RAPID DISSOLVE: 4 mg | ORAL | Qty: 1

## 2015-01-28 MED FILL — FLUOXETINE 20 MG CAP: 20 mg | ORAL | Qty: 2

## 2015-01-28 NOTE — ED Notes (Signed)
Pt resting on right side with eyes closed. Respirations even and nonlabored. arousable to verbal. Denies complaints. Will continue to monitor.

## 2015-01-28 NOTE — ED Notes (Signed)
Report to [facility]

## 2015-01-28 NOTE — ED Notes (Signed)
Pt moved from hallway into room 15. Sitter at doorway. Pt remains calm and cooperative. Denies complaints. Urinal provided per pt request

## 2015-01-28 NOTE — ED Notes (Signed)
No change in pt at this time. Continues to rest on right side with eyes closed. Nad. Will continue to monitor.

## 2015-01-28 NOTE — Progress Notes (Signed)
===================================================================  [name] Psych[facility] RECHECK NOTE for 01/28/2015    Miguel Ellis is a 60 y.o. male here for SI  ---he arrived on 01/27/2015  ---he is on papers  ---he has completed a tele-psych evaluation    Subjective:59 YO MALE WITH SI    Objective:  Visit Vitals   ??? BP 105/70 (BP 1 Location: Left arm, BP Patient Position: At rest)   ??? Pulse (!) 56   ??? Temp 98 ??F (36.7 ??C)   ??? Resp 18   ??? Ht  (1.803 m)   ??? Wt 90.7 kg (200 lb)   ??? SpO2 100%   ??? BMI 27.89 kg/m2     Recent Results (from the past 24 hour(s))   DRUG SCREEN, URINE    Collection Time: 01/27/15 10:41 PM   Result Value Ref Range    PCP(PHENCYCLIDINE) NEGATIVE       BENZODIAZEPINE NEGATIVE       COCAINE NEGATIVE       AMPHETAMINE POSITIVE      METHADONE NEGATIVE       THC (TH-CANNABINOL) NEGATIVE       OPIATES NEGATIVE       BARBITURATES NEGATIVE      CBC WITH AUTOMATED DIFF    Collection Time: 01/27/15 11:38 PM   Result Value Ref Range    WBC 6.2 4.3 - 11.1 K/uL    RBC 5.25 4.23 - 5.67 M/uL    HGB 14.1 13.6 - 17.2 g/dL    HCT 16.1 09.6 - 04.5 %    MCV 82.9 79.6 - 97.8 FL    MCH 26.9 26.1 - 32.9 PG    MCHC 32.4 31.4 - 35.0 g/dL    RDW 40.9 (H) 81.1 - 14.6 %    PLATELET 288 150 - 450 K/uL    MPV 9.8 (L) 10.8 - 14.1 FL    DF AUTOMATED      NEUTROPHILS 60 43 - 78 %    LYMPHOCYTES 31 13 - 44 %    MONOCYTES 6 4.0 - 12.0 %    EOSINOPHILS 3 0.5 - 7.8 %    BASOPHILS 0 0.0 - 2.0 %    IMMATURE GRANULOCYTES 0.2 0.0 - 5.0 %    ABS. NEUTROPHILS 3.7 1.7 - 8.2 K/UL    ABS. LYMPHOCYTES 1.9 0.5 - 4.6 K/UL    ABS. MONOCYTES 0.4 0.1 - 1.3 K/UL    ABS. EOSINOPHILS 0.2 0.0 - 0.8 K/UL    ABS. BASOPHILS 0.0 0.0 - 0.2 K/UL    ABS. IMM. GRANS. 0.0 0.0 - 0.5 K/UL   METABOLIC PANEL, BASIC    Collection Time: 01/27/15 11:38 PM   Result Value Ref Range    Sodium 141 136 - 145 mmol/L    Potassium 4.2 3.5 - 5.1 mmol/L    Chloride 103 98 - 107 mmol/L    CO2 32 21 - 32 mmol/L    Anion gap 6 (L) 7 - 16 mmol/L     Glucose 92 65 - 100 mg/dL    BUN 20 6 - 23 MG/DL    Creatinine 9.14 0.8 - 1.5 MG/DL    GFR est AA >78 >29 FA/OZH/0.86V7    GFR est non-AA 59 (L) >60 ml/min/1.73m2    Calcium 8.8 8.3 - 10.4 MG/DL   ETHYL ALCOHOL    Collection Time: 01/27/15 11:38 PM   Result Value Ref Range    ALCOHOL(ETHYL),SERUM <3 MG/DL   ACETAMINOPHEN    Collection Time: 01/27/15  11:38 PM   Result Value Ref Range    ACETAMINOPHEN 10 10.0 - 30.0 ug/mL   SALICYLATE    Collection Time: 01/27/15 11:38 PM   Result Value Ref Range    SALICYLATE <1.7 (L) 2.8 - 20.0 MG/DL     No results found.  @      Assessment: Continued SI this morning, no further complaints    Plan: Will work on placement today with social work     Dalene Seltzer, MD, 8:12 AM, 01/28/2015  ===================================================================

## 2015-01-28 NOTE — Progress Notes (Signed)
Patient placed on list a Miguel Ellis

## 2015-01-28 NOTE — Progress Notes (Signed)
Met with patient in the ER.  Patient recently discharged from [facility] (01-20-15) and sent to [facility].  Patient states they released him after 5 days, he was transported back to [place], he went to [facility] and got "some" of his prescriptions filled but was feeling "really depressed" so he came back to [place].    Discussed with patient his long term goals.  Patient states he wants to be productive and live at home, and be "productive again."    Discussed how the process works and that patient will be on a waiting list, but again explaining that if he wants to be productive again, he will need to follow through with [facility] because rotating in and out of the ER and psychiatric hospitals may not be the best plan.  Patient is in agreement.  Patient unsure if he has appt with [facility] but states it may be towards the end of this week.  Patient states he did not call [facility] but just came straight to the ER.  Called [facility] liaison) and she states patient does not have an appt in their system.

## 2015-01-28 NOTE — ED Notes (Signed)
Sandwich and drink provided per pt request. Pt calm and cooperative. Resting in bed in hallway with siderails up x2. Will continue to monitor.

## 2015-01-28 NOTE — ED Provider Notes (Addendum)
HPI Comments: Patient presents to our department stating he is in deep depression.  He has suicidal thoughts and states that he has sleeping pills at home that he has considered taking an attempt to end his life.  He denies having done this tonight but in the past has tried an overdose in a prior suicide attempt.  He does not have a local psychiatrist.  He just recently gone to [facility] ER and from there was transferred to [facility] where he was in a psychiatric facility for 5 days and released today.states that he had told psychiatrist as an inpatient that he was doing better but upon leaving he became desponded again and came to our department with these concerns.  He is presently on Prozac 40 mg per day.    Has no family in [place].he had moved here with his wife with her banking job and before eating starting she died of cardiac event at the end of December.  Additionally he has a daughter who had 2 young children and was in her 81s who died of leukemia in 2022/09/12 of this past year.  He denies any street drugs or alcohol.    Patient is a 60 y.o. male presenting with suicidal ideation. The history is provided by the patient.   Suicidal   This is a new problem. Pertinent negatives include no agitation, no mental status change and no disorientation. There has been no fever. Pertinent negatives include no altered mental status and no confusion. There were no medications administered prior to arrival. Associated medical issues do not include trauma, seizures or CVA.        Past Medical History:   Diagnosis Date   ??? HTN (hypertension)    ??? Major depressive disorder 01/17/2015     With prior history of same Multiple recent losses   ??? MI (myocardial infarction) (HCC)        History reviewed. No pertinent past surgical history.      History reviewed. No pertinent family history.    Social History     Social History   ??? Marital status: SINGLE     Spouse name: N/A   ??? Number of children: N/A    ??? Years of education: N/A     Occupational History   ??? Not on file.     Social History Main Topics   ??? Smoking status: Former Smoker   ??? Smokeless tobacco: Not on file   ??? Alcohol use No   ??? Drug use: No   ??? Sexual activity: Not on file     Other Topics Concern   ??? Not on file     Social History Narrative         ALLERGIES: Aspirin    Review of Systems   Constitutional: Negative for chills and fever.   Respiratory: Negative.    Cardiovascular: Negative.    Psychiatric/Behavioral: Positive for dysphoric mood and suicidal ideas. Negative for agitation, behavioral problems, confusion and self-injury.   All other systems reviewed and are negative.      Vitals:    01/27/15 1944   BP: 113/71   Pulse: 69   Resp: 18   Temp: 99.4 ??F (37.4 ??C)   SpO2: 100%   Weight: 90.7 kg (200 lb)   Height:  (1.803 m)            Physical Exam   Constitutional: He appears well-developed and well-nourished. No distress.   Good eye contact  Well groomed  HENT:   Head: Atraumatic.   Mouth/Throat: Oropharynx is clear and moist.   Eyes: No scleral icterus.   Neck: Neck supple.   Cardiovascular: Normal rate, regular rhythm and intact distal pulses.    Pulmonary/Chest: Effort normal. No respiratory distress. He has no wheezes.   Abdominal: Soft. There is no tenderness. There is no rebound.   Skin: Skin is warm and dry.   Psychiatric: His behavior is normal. Thought content normal.   Nursing note and vitals reviewed.       MDM  Number of Diagnoses or Management Options  Diagnosis management comments: Very pleasant cooperative capable patient here with significant depression and self harming thoughts.  He has quite dramatic recent social stressors including death of a child and spouse within the last 4 or 5 months.  Drug or alcohol use.  He is just been released from psychiatric facility in [place].hospitalized there for 5 days.  He will need psychiatric consultation will do basic lab work.  He will be placed on papers.        Amount and/or Complexity of Data Reviewed  Clinical lab tests: ordered and reviewed  Decide to obtain previous medical records or to obtain history from someone other than the patient: yes    Risk of Complications, Morbidity, and/or Mortality  Presenting problems: high  Diagnostic procedures: low  Management options: moderate      ED Course       Procedures    Recent Results (from the past 12 hour(s))   DRUG SCREEN, URINE    Collection Time: 01/27/15 10:41 PM   Result Value Ref Range    PCP(PHENCYCLIDINE) NEGATIVE       BENZODIAZEPINE NEGATIVE       COCAINE NEGATIVE       AMPHETAMINE POSITIVE      METHADONE NEGATIVE       THC (TH-CANNABINOL) NEGATIVE       OPIATES NEGATIVE       BARBITURATES NEGATIVE      CBC WITH AUTOMATED DIFF    Collection Time: 01/27/15 11:38 PM   Result Value Ref Range    WBC 6.2 4.3 - 11.1 K/uL    RBC 5.25 4.23 - 5.67 M/uL    HGB 14.1 13.6 - 17.2 g/dL    HCT 16.1 09.6 - 04.5 %    MCV 82.9 79.6 - 97.8 FL    MCH 26.9 26.1 - 32.9 PG    MCHC 32.4 31.4 - 35.0 g/dL    RDW 40.9 (H) 81.1 - 14.6 %    PLATELET 288 150 - 450 K/uL    MPV 9.8 (L) 10.8 - 14.1 FL    DF AUTOMATED      NEUTROPHILS 60 43 - 78 %    LYMPHOCYTES 31 13 - 44 %    MONOCYTES 6 4.0 - 12.0 %    EOSINOPHILS 3 0.5 - 7.8 %    BASOPHILS 0 0.0 - 2.0 %    IMMATURE GRANULOCYTES 0.2 0.0 - 5.0 %    ABS. NEUTROPHILS 3.7 1.7 - 8.2 K/UL    ABS. LYMPHOCYTES 1.9 0.5 - 4.6 K/UL    ABS. MONOCYTES 0.4 0.1 - 1.3 K/UL    ABS. EOSINOPHILS 0.2 0.0 - 0.8 K/UL    ABS. BASOPHILS 0.0 0.0 - 0.2 K/UL    ABS. IMM. GRANS. 0.0 0.0 - 0.5 K/UL   METABOLIC PANEL, BASIC    Collection Time: 01/27/15 11:38 PM   Result Value Ref Range    Sodium  141 136 - 145 mmol/L    Potassium 4.2 3.5 - 5.1 mmol/L    Chloride 103 98 - 107 mmol/L    CO2 32 21 - 32 mmol/L    Anion gap 6 (L) 7 - 16 mmol/L    Glucose 92 65 - 100 mg/dL    BUN 20 6 - 23 MG/DL    Creatinine 1.61 0.8 - 1.5 MG/DL    GFR est AA >09 >60 AV/WUJ/8.11B1    GFR est non-AA 59 (L) >60 ml/min/1.23m2     Calcium 8.8 8.3 - 10.4 MG/DL   ETHYL ALCOHOL    Collection Time: 01/27/15 11:38 PM   Result Value Ref Range    ALCOHOL(ETHYL),SERUM <3 MG/DL   ACETAMINOPHEN    Collection Time: 01/27/15 11:38 PM   Result Value Ref Range    ACETAMINOPHEN 10 10.0 - 30.0 ug/mL   SALICYLATE    Collection Time: 01/27/15 11:38 PM   Result Value Ref Range    SALICYLATE <1.7 (L) 2.8 - 20.0 MG/DL

## 2015-01-29 MED FILL — DIPHENHYDRAMINE 25 MG CAP: 25 mg | ORAL | Qty: 1

## 2015-01-29 MED FILL — FLUOXETINE 20 MG CAP: 20 mg | ORAL | Qty: 2

## 2015-01-29 NOTE — ED Notes (Signed)
No apparent distress. Sitter present.

## 2015-01-29 NOTE — ED Notes (Signed)
No apparent distress. Sitter present. Pt given meal tray for breakfast.

## 2015-01-29 NOTE — ED Notes (Signed)
Report given to Lewie Loron, RN and care transferred at this time. Pt in no apparent distress and sitter present.

## 2015-01-29 NOTE — ED Notes (Signed)
patient states he still has self harming thoughts and feels as though he needs to be in the hospital setting.  Has family support in [place] in [facility] but none locally.  States that he feels that he needs become more stable as far as psychiatric issues before he considers any transitions Exline requests ability to take a shower

## 2015-01-29 NOTE — ED Notes (Signed)
No apparent distress. Sitter present. Pt medicated as ordered.

## 2015-01-29 NOTE — ED Notes (Signed)
Received bedside report from Tracy Mallory, RN and assumed care at this time.

## 2015-01-29 NOTE — ED Notes (Signed)
No apparent distress. Sitter present. Meal tray given for supper.

## 2015-01-29 NOTE — ED Notes (Signed)
No apparent distress. Sitter present. Pt given meal tray for lunch.

## 2015-01-30 MED FILL — FLUOXETINE 20 MG CAP: 20 mg | ORAL | Qty: 2

## 2015-01-30 MED FILL — DIPHENHYDRAMINE 25 MG CAP: 25 mg | ORAL | Qty: 1

## 2015-01-30 NOTE — ED Notes (Signed)
Psychiatric consultation obtained and after interview patient was felt not to be at risk to harm to self.  After several phone calls and attempts to get him an appointment to be seen today at [facility] or [facility] mental health where form there were none available and he has an appointment on February 2 at [facility] mental health.  Patient indicated that he was comfortable with this and was requesting to be discharged.  His involuntary commitment papers were rescinded by the psychiatrist

## 2015-01-30 NOTE — ED Notes (Signed)
Reported off to Luke RN

## 2015-01-30 NOTE — Progress Notes (Signed)
Spoke with Adult nurse at [facility] and she states they [facility]) are in compliance in that they have 5 days to see patient once a referral is made (pts appt is Feb 2nd at 1pm) / especially if patient does not already have an open case.    Dr. Grace Isaac aware of information.

## 2015-01-30 NOTE — Progress Notes (Addendum)
Spoke with [facility] / appt time given for Feb. 2nd at 1 pm.    Per Dr. Grace Isaac, they would like patient seen after discharge (today), if possible.  Called [facility] back and they state Feb. 2nd is the first time available to see patient.  Placed a call to Octavio Manns at [facility] / waiting on call back.  Information faxed to [facility].

## 2015-01-30 NOTE — ED Notes (Signed)
Pt sleeping sitter with PT

## 2015-01-30 NOTE — ED Notes (Signed)
No apparent distress. Sitter present.

## 2015-01-30 NOTE — ED Notes (Signed)
Assumed care of patient at this time. Report given by Luke RN

## 2015-01-30 NOTE — ED Notes (Signed)
PT sleeping sitter with PT

## 2015-01-30 NOTE — Progress Notes (Signed)
Made patient aware of next available "new patient" appt with [facility].  Patient in agreement and states he can go to appt.  Also, made patient aware that if he feels he needs to return to ER or go to [facility] prior to appt to get seen, he is welcome to do that.  Patient in agreement with plan.

## 2015-01-30 NOTE — ED Notes (Signed)
I have reviewed discharge instructions with the patient.  The patient verbalized understanding.

## 2015-01-30 NOTE — ED Notes (Signed)
===================================================================    [name] Psych[facility] RECHECK NOTE for 01/30/2015    Miguel Ellis is a 60 y.o. male here for depression, suicidal ideation  ---he arrived on 01/27/2015  ---he is on papers  ---he has not completed a tele-psych evaluation    Subjective: Pt upset that he has not been able to shower, shave and brush his teeth.  He feels he is not being provided the "excellent care" that is promised to him on the board in the room.      Objective:  Visit Vitals   ??? BP 132/89 (BP 1 Location: Left arm, BP Patient Position: At rest)   ??? Pulse (!) 58   ??? Temp 98 ??F (36.7 ??C)   ??? Resp 18   ??? Ht  (1.803 m)   ??? Wt 90.7 kg (200 lb)   ??? SpO2 100%   ??? BMI 27.89 kg/m2     No results found for this or any previous visit (from the past 24 hour(s)).  No results found.  @      Assessment: Depression.  Pt was just released from inpatient unit prior to coming to this ER, which he did as soon as he arrived back in [place]. Drug screen was positive for Amphetamines     Plan: Telepsych consult;     Arna Medici, DO, 9:15 AM, 01/30/2015  ===================================================================
